# Patient Record
Sex: Male | Born: 1995 | Race: White | Hispanic: No | Marital: Single | Smoking: Former smoker
Health system: Southern US, Community
[De-identification: ages and names within clinical notes are randomized; demographics above are authoritative.]

## PROBLEM LIST (undated history)

## (undated) DIAGNOSIS — E119 Type 2 diabetes mellitus without complications: Secondary | ICD-10-CM

---
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
## 2017-02-18 ENCOUNTER — Other Ambulatory Visit: Payer: Self-pay

## 2017-02-18 MED ORDER — INSULIN ASPART 100 UNIT/ML FLEXPEN: PEN_INJECTOR | SUBCUTANEOUS | 2 refills | Status: DC

## 2017-03-24 ENCOUNTER — Other Ambulatory Visit: Payer: Self-pay

## 2017-03-24 MED ORDER — INSULIN GLARGINE 100 UNIT/ML SOLOSTAR PEN: PEN_INJECTOR | SUBCUTANEOUS | 2 refills | Status: DC

## 2017-04-18 ENCOUNTER — Ambulatory Visit: Payer: Medicaid Other | Admitting: "Endocrinology

## 2017-04-28 LAB — COMPLETE METABOLIC PANEL WITH GFR
AG RATIO: 1.9 (calc) (ref 1.0–2.5)
ALT: 13 U/L (ref 8–46)
AST: 12 U/L (ref 12–32)
Albumin: 4.7 g/dL (ref 3.6–5.1)
Alkaline phosphatase (APISO): 85 U/L (ref 48–230)
BILIRUBIN TOTAL: 0.8 mg/dL (ref 0.2–1.1)
BUN: 13 mg/dL (ref 7–20)
CO2: 29 mmol/L (ref 20–32)
Calcium: 10.1 mg/dL (ref 8.9–10.4)
Chloride: 100 mmol/L (ref 98–110)
Creat: 0.92 mg/dL (ref 0.60–1.26)
GFR, Est African American: 139 mL/min/{1.73_m2} (ref >=60)
GFR, Est Non African American: 120 mL/min/{1.73_m2} (ref >=60)
Globulin: 2.5 g/dL (calc) (ref 2.1–3.5)
Glucose, Bld: 327 mg/dL — ABNORMAL HIGH (ref 65–99)
POTASSIUM: 4.8 mmol/L (ref 3.8–5.1)
SODIUM: 137 mmol/L (ref 135–146)
Total Protein: 7.2 g/dL (ref 6.3–8.2)

## 2017-04-28 LAB — HEMOGLOBIN A1C
Hgb A1c MFr Bld: 8.7 % of total Hgb — ABNORMAL HIGH (ref <5.7)
Mean Plasma Glucose: 203 (calc)
eAG (mmol/L): 11.2 (calc)

## 2017-04-28 LAB — VITAMIN D 25 HYDROXY (VIT D DEFICIENCY, FRACTURES): Vit D, 25-Hydroxy: 24 ng/mL — ABNORMAL LOW (ref 30–100)

## 2017-05-06 ENCOUNTER — Ambulatory Visit: Payer: Medicaid Other | Admitting: "Endocrinology

## 2017-06-11 ENCOUNTER — Other Ambulatory Visit: Payer: Self-pay | Admitting: "Endocrinology

## 2017-07-24 ENCOUNTER — Other Ambulatory Visit: Payer: Self-pay | Admitting: "Endocrinology

## 2017-07-26 ENCOUNTER — Other Ambulatory Visit: Payer: Self-pay | Admitting: "Endocrinology

## 2017-07-26 DIAGNOSIS — E1065 Type 1 diabetes mellitus with hyperglycemia: Secondary | ICD-10-CM

## 2017-08-01 ENCOUNTER — Other Ambulatory Visit: Payer: Self-pay | Admitting: "Endocrinology

## 2017-08-02 NOTE — Telephone Encounter (Signed)
No refills until he returns for OV with repeat labs. He can get a sample pen to cover any gaps.

## 2017-08-03 ENCOUNTER — Telehealth: Payer: Self-pay | Admitting: "Endocrinology

## 2017-08-03 NOTE — Telephone Encounter (Signed)
I have responded to this message yesterday, he can get samples to cover gaps.  He has to have labs and office visit before we refill.

## 2017-08-03 NOTE — Telephone Encounter (Signed)
Ray Espinoza is scheduled on 08/23/17 and his grandmother is asking if Dr. Fransico HimNida would call in Novolog until he can be seen he is completely out, please advise?

## 2017-08-05 LAB — COMPREHENSIVE METABOLIC PANEL
AG Ratio: 2.1 (calc) (ref 1.0–2.5)
ALKALINE PHOSPHATASE (APISO): 88 U/L (ref 40–115)
ALT: 11 U/L (ref 9–46)
AST: 12 U/L (ref 10–40)
Albumin: 4.9 g/dL (ref 3.6–5.1)
BUN: 16 mg/dL (ref 7–25)
CO2: 30 mmol/L (ref 20–32)
CREATININE: 0.92 mg/dL (ref 0.60–1.35)
Calcium: 10.3 mg/dL (ref 8.6–10.3)
Chloride: 100 mmol/L (ref 98–110)
Globulin: 2.3 g/dL (calc) (ref 1.9–3.7)
Glucose, Bld: 256 mg/dL — ABNORMAL HIGH (ref 65–99)
Potassium: 4.6 mmol/L (ref 3.5–5.3)
Sodium: 138 mmol/L (ref 135–146)
Total Bilirubin: 0.6 mg/dL (ref 0.2–1.2)
Total Protein: 7.2 g/dL (ref 6.1–8.1)

## 2017-08-05 LAB — HEMOGLOBIN A1C
HEMOGLOBIN A1C: 7.5 %{Hb} — AB (ref <5.7)
MEAN PLASMA GLUCOSE: 169 (calc)
eAG (mmol/L): 9.3 (calc)

## 2017-08-09 ENCOUNTER — Ambulatory Visit (INDEPENDENT_AMBULATORY_CARE_PROVIDER_SITE_OTHER): Payer: Medicaid Other | Admitting: "Endocrinology

## 2017-08-09 ENCOUNTER — Encounter: Payer: Self-pay | Admitting: "Endocrinology

## 2017-08-09 VITALS — BP 136/81 | HR 80 | Ht 66.0 in | Wt 164.0 lb

## 2017-08-09 DIAGNOSIS — Z9119 Patient's noncompliance with other medical treatment and regimen: Secondary | ICD-10-CM | POA: Diagnosis not present

## 2017-08-09 DIAGNOSIS — E1065 Type 1 diabetes mellitus with hyperglycemia: Secondary | ICD-10-CM | POA: Diagnosis not present

## 2017-08-09 DIAGNOSIS — Z91199 Patient's noncompliance with other medical treatment and regimen due to unspecified reason: Secondary | ICD-10-CM

## 2017-08-09 NOTE — Progress Notes (Signed)
Subjective:    Patient ID: Ray Espinoza, male    DOB: 1995-07-22. Patient is being seen in f/u for management of diabetes requested by  Sharilyn Sites, MD  Past Medical History:  Diagnosis Date  . Diabetes mellitus without complication ([place])    History reviewed. No pertinent surgical history. Social History   Socioeconomic History  . Marital status: Single    Spouse name: Not on file  . Number of children: Not on file  . Years of education: Not on file  . Highest education level: Not on file  Occupational History  . Not on file  Social Needs  . Financial resource strain: Not on file  . Food insecurity:    Worry: Not on file    Inability: Not on file  . Transportation needs:    Medical: Not on file    Non-medical: Not on file  Tobacco Use  . Smoking status: Never Smoker  . Smokeless tobacco: Never Used  Substance and Sexual Activity  . Alcohol use: No  . Drug use: No  . Sexual activity: Not on file  Lifestyle  . Physical activity:    Days per week: Not on file    Minutes per session: Not on file  . Stress: Not on file  Relationships  . Social connections:    Talks on phone: Not on file    Gets together: Not on file    Attends religious service: Not on file    Active member of club or organization: Not on file    Attends meetings of clubs or organizations: Not on file    Relationship status: Not on file  Other Topics Concern  . Not on file  Social History Narrative  . Not on file   Outpatient Encounter Medications as of 08/09/2017  Medication Sig  . GLOBAL EASE INJECT PEN NEEDLES 31G X 5 MM MISC USE AS DIRECTED FOUR TIMES DAILY  . Insulin Glargine (LANTUS SOLOSTAR) 100 UNIT/ML Solostar Pen INJECT 30 UNITS INTO THE SKIN AT BEDTIME  . NOVOLOG FLEXPEN 100 UNIT/ML FlexPen INJECT 10 - 16 UNITS UNDER THE SKIN THREE TIMES DAILY BEFORE MEALS  . [DISCONTINUED] ACCU-CHEK AVIVA PLUS test strip USE UP TO FOUR TIMES DAILY AS DIRECTED  . [DISCONTINUED] blood  glucose meter kit and supplies KIT Dispense based on patient and insurance preference. Use up to four times daily as directed. (FOR ICD-10 E10.65).  . [DISCONTINUED] GLOBAL EASE INJECT PEN NEEDLES 31G X 5 MM MISC USE AS DIRECTED FOUR TIMES DAILY   No facility-administered encounter medications on file as of 08/09/2017.    ALLERGIES: Allergies  Allergen Reactions  . Codeine Hives and Itching   VACCINATION STATUS:  There is no immunization history on file for this patient.  Diabetes  He presents for his follow-up diabetic visit. He has type 1 diabetes mellitus. Onset time: He was diagnosed at approximate age of 40 years. His disease course has been stable (He was diagnosed with A1c of 14.1%. ). There are no hypoglycemic associated symptoms. Pertinent negatives for hypoglycemia include no confusion, headaches, pallor or seizures. Pertinent negatives for diabetes include no chest pain, no fatigue, no polydipsia, no polyphagia, no polyuria and no weakness. There are no hypoglycemic complications. Symptoms are stable. There are no diabetic complications. Risk factors for coronary artery disease include diabetes mellitus. Current diabetic treatments: He is using Lantus 31 units daily at bedtime and NovoLog 1-7 units 3 times a day before meals. Compliance with diabetes treatment: He  missed at least a third of his insulin and monitoring opportunities. He sleeps through the morning and misses breakfast on a regular basis. His weight is fluctuating minimally. He is following a generally unhealthy diet. When asked about meal planning, he reported none. He has not had a previous visit with a dietitian. He participates in exercise intermittently. Blood glucose monitoring compliance is poor. His home blood glucose trend is decreasing steadily. His overall blood glucose range is 140-180 mg/dl. (He came with a meter showing random monitoring of less than 1 time a day average of 179-  22 readings in the last 30 days was  supposed to monitor blood glucose 4 times a day.) An ACE inhibitor/angiotensin II receptor blocker is not being taken. He does not see a podiatrist.Eye exam is not current.    Review of Systems  Constitutional: Negative for chills, fatigue, fever and unexpected weight change.  HENT: Negative for dental problem, mouth sores and trouble swallowing.   Eyes: Negative for visual disturbance.  Respiratory: Negative for cough, choking, chest tightness, shortness of breath and wheezing.   Cardiovascular: Negative for chest pain, palpitations and leg swelling.  Gastrointestinal: Negative for abdominal distention, abdominal pain, constipation, diarrhea, nausea and vomiting.  Endocrine: Negative for polydipsia, polyphagia and polyuria.  Genitourinary: Negative for dysuria, flank pain, hematuria and urgency.  Musculoskeletal: Negative for back pain, gait problem, myalgias and neck pain.  Skin: Negative for pallor, rash and wound.  Neurological: Negative for seizures, syncope, weakness, numbness and headaches.  Psychiatric/Behavioral: Negative.  Negative for confusion and dysphoric mood.    Objective:    BP 136/81   Pulse 80   Ht '5\' 6"'$  (1.676 m)   Wt 164 lb (74.4 kg)   BMI 26.47 kg/m   Wt Readings from Last 3 Encounters:  08/09/17 164 lb (74.4 kg)  12/27/16 161 lb (73 kg) (60 %, Z= 0.25)*  06/15/16 159 lb (72.1 kg) (60 %, Z= 0.25)*   * Growth percentiles are based on CDC (Boys, 2-20 Years) data.    Physical Exam  Constitutional: He is oriented to person, place, and time. He appears well-developed. He is cooperative. No distress.  HENT:  Head: Normocephalic and atraumatic.  Eyes: EOM are normal.  Neck: Normal range of motion. Neck supple. No tracheal deviation present. No thyromegaly present.  Cardiovascular: Normal rate, S1 normal and S2 normal. Exam reveals no gallop.  No murmur heard. Pulses:      Dorsalis pedis pulses are 1+ on the right side, and 1+ on the left side.       Posterior  tibial pulses are 1+ on the right side, and 1+ on the left side.  Pulmonary/Chest: Effort normal. No respiratory distress. He has no wheezes.  Abdominal: He exhibits no distension. There is no tenderness. There is no guarding and no CVA tenderness.  Musculoskeletal: He exhibits no edema.       Right shoulder: He exhibits no swelling and no deformity.  Neurological: He is alert and oriented to person, place, and time. He has normal strength. No cranial nerve deficit or sensory deficit. Gait normal.  Skin: Skin is warm and dry. No rash noted. No cyanosis. Nails show no clubbing.  Psychiatric: He has a normal mood and affect. His speech is normal. Judgment normal. Cognition and memory are normal.   CMP     Component Value Date/Time   NA 138 08/04/2017 0809   K 4.6 08/04/2017 0809   CL 100 08/04/2017 0809   CO2 30 08/04/2017  0809   GLUCOSE 256 (H) 08/04/2017 0809   BUN 16 08/04/2017 0809   BUN 15 12/20/2016   CREATININE 0.92 08/04/2017 0809   CALCIUM 10.3 08/04/2017 0809   PROT 7.2 08/04/2017 0809   ALBUMIN 4.8 03/08/2016 0837   AST 12 08/04/2017 0809   ALT 11 08/04/2017 0809   ALKPHOS 108 03/08/2016 0837   BILITOT 0.6 08/04/2017 0809   GFRNONAA 120 04/27/2017 1300   GFRAA 139 04/27/2017 1300    Diabetic Labs (most recent): Lab Results  Component Value Date   HGBA1C 7.5 (H) 08/04/2017   HGBA1C 8.7 (H) 04/27/2017   HGBA1C 7.8 (H) 12/20/2016     Assessment & Plan:   1. Uncontrolled type 1 diabetes mellitus   - Patient has currently uncontrolled symptomatic type 2 DM since  22 years of age. -Once again, he shows up after missing his appointment since November 2018.  His A1c remains stable at 7.5%, 7.8% during his last visit, generally improving from 14.1%. -Did not comply with monitoring recommendations. - I reviewed his most recent labs with him. These show normal renal function and chemistry.  He does not report any gross competitions of diabetes, however, given his  noncompliance to follow-ups and recommendations, patient remains at a high risk for more acute and chronic complications of diabetes which include CAD, CVA, CKD, retinopathy, and neuropathy. These are all discussed in detail with the patient.  - I have counseled the patient on diet management  by adopting a carbohydrate restricted/protein rich diet.  -  Suggestion is made for him to avoid simple carbohydrates  from his diet including Cakes, Sweet Desserts / Pastries, Ice Cream, Soda (diet and regular), Sweet Tea, Candies, Chips, Cookies, Store Bought Juices, Alcohol in Excess of  1-2 drinks a day, Artificial Sweeteners, and "Sugar-free" Products. This will help patient to have stable blood glucose profile and potentially avoid unintended weight gain.  - I encouraged the patient to switch to  unprocessed or minimally processed complex starch and increased protein intake (animal or plant source), fruits, and vegetables.  - Patient is advised to stick to a routine mealtimes to eat 3 meals  a day and avoid unnecessary snacks ( to snack only to correct hypoglycemia).   - I have approached patient with the following individualized plan to manage diabetes and patient agrees:   -Once again, he missed his appointments since November 2018.  -He did not bring his insulin injection activities nor did he monitor enough.  This makes it difficult to stabilize his insulin doses. -  I advised him to continue  Lantus  30 units daily at bedtime, and  NovoLog   10 units  3 times a day before meals for pre-meal BG readings of 90-140m/dl, plus patient specific correction dose for unexpected hyperglycemia above 1534mdl, associated with strict monitoring of blood glucose 4 times a day-before meals and at bedtime and return in 1 to 2 weeks for reevaluation. - Patient is warned not to take insulin without proper monitoring per orders. -Adjustment parameters are given for hypo and hyperglycemia in writing. -Patient is  encouraged to call clinic for blood glucose levels less than 70 or above 300 mg /dl. - Insulin is the exclusive choice of therapy for his diabetes. - He would have benefitted from continued glucose monitoring, however he is not interested to get it.  - Patient specific target  A1c;  LDL, HDL, Triglycerides, and  Waist Circumference were discussed in detail.  2) BP/HTN: His blood pressure is  controlled to target.   He is not on any medications for blood pressure.   3) Lipids/HPL:  Controlled based on his labs with LDL of 84 and HDL 46 from December 2016. 4)  Weight/Diet: CDE Consult is initiated , exercise, and detailed carbohydrates information provided.  5) Chronic Care/Health Maintenance:  -Patient is encouraged to continue to follow up with Ophthalmology, Podiatrist at least yearly or according to recommendations, and advised to   stay away from smoking. I have recommended yearly flu vaccine and pneumonia vaccination at least every 5 years; moderate intensity exercise for up to 150 minutes weekly; and  sleep for at least 7 hours a day.  - I advised patient to maintain close follow up with Sharilyn Sites, MD for primary care needs.  - Time spent with the patient: 25 min, of which >50% was spent in reviewing his blood glucose logs , discussing his hypo- and hyper-glycemic episodes, reviewing his current and  previous labs and insulin doses and developing a plan to avoid hypo- and hyper-glycemia. Please refer to Patient Instructions for Blood Glucose Monitoring and Insulin/Medications Dosing Guide"  in media tab for additional information. Margretta Sidle Giovannini participated in the discussions, expressed understanding, and voiced agreement with the above plans.  All questions were answered to his satisfaction. he is encouraged to contact clinic should he have any questions or concerns prior to his return visit.    Follow up plan: - Return in about 1 week (around 08/16/2017) for follow up with meter  and logs- no labs.  Glade Lloyd, MD Phone: [phone]  Fax: [phone]  -  This note was partially dictated with voice recognition software. Similar sounding words can be transcribed inadequately or may not  be corrected upon review.  08/09/2017, 11:05 AM

## 2017-08-15 ENCOUNTER — Other Ambulatory Visit: Payer: Self-pay

## 2017-08-15 MED ORDER — INSULIN ASPART 100 UNIT/ML FLEXPEN: PEN_INJECTOR | SUBCUTANEOUS | 2 refills | Status: DC

## 2017-08-15 MED ORDER — INSULIN GLARGINE 100 UNIT/ML SOLOSTAR PEN: PEN_INJECTOR | SUBCUTANEOUS | 2 refills | Status: DC

## 2017-08-18 ENCOUNTER — Ambulatory Visit (INDEPENDENT_AMBULATORY_CARE_PROVIDER_SITE_OTHER): Payer: Medicaid Other | Admitting: "Endocrinology

## 2017-08-18 ENCOUNTER — Encounter: Payer: Self-pay | Admitting: "Endocrinology

## 2017-08-18 VITALS — BP 130/69 | HR 94 | Ht 66.0 in | Wt 163.0 lb

## 2017-08-18 DIAGNOSIS — Z9119 Patient's noncompliance with other medical treatment and regimen: Secondary | ICD-10-CM | POA: Diagnosis not present

## 2017-08-18 DIAGNOSIS — Z91199 Patient's noncompliance with other medical treatment and regimen due to unspecified reason: Secondary | ICD-10-CM

## 2017-08-18 DIAGNOSIS — E1065 Type 1 diabetes mellitus with hyperglycemia: Secondary | ICD-10-CM

## 2017-08-18 NOTE — Progress Notes (Signed)
Subjective:    Patient ID: Ray Espinoza, male    DOB: Jun 01, 1995. Patient is being seen in f/u for management of diabetes requested by  Assunta Found, MD  Past Medical History:  Diagnosis Date  . Diabetes mellitus without complication (HCC)    History reviewed. No pertinent surgical history. Social History   Socioeconomic History  . Marital status: Single    Spouse name: Not on file  . Number of children: Not on file  . Years of education: Not on file  . Highest education level: Not on file  Occupational History  . Not on file  Social Needs  . Financial resource strain: Not on file  . Food insecurity:    Worry: Not on file    Inability: Not on file  . Transportation needs:    Medical: Not on file    Non-medical: Not on file  Tobacco Use  . Smoking status: Never Smoker  . Smokeless tobacco: Never Used  Substance and Sexual Activity  . Alcohol use: No  . Drug use: No  . Sexual activity: Not on file  Lifestyle  . Physical activity:    Days per week: Not on file    Minutes per session: Not on file  . Stress: Not on file  Relationships  . Social connections:    Talks on phone: Not on file    Gets together: Not on file    Attends religious service: Not on file    Active member of club or organization: Not on file    Attends meetings of clubs or organizations: Not on file    Relationship status: Not on file  Other Topics Concern  . Not on file  Social History Narrative  . Not on file   Outpatient Encounter Medications as of 08/18/2017  Medication Sig  . GLOBAL EASE INJECT PEN NEEDLES 31G X 5 MM MISC USE AS DIRECTED FOUR TIMES DAILY  . insulin aspart (NOVOLOG FLEXPEN) 100 UNIT/ML FlexPen INJECT 10 - 16 UNITS UNDER THE SKIN THREE TIMES DAILY BEFORE MEALS  . Insulin Glargine (LANTUS SOLOSTAR) 100 UNIT/ML Solostar Pen INJECT 30 UNITS INTO THE SKIN AT BEDTIME   No facility-administered encounter medications on file as of 08/18/2017.    ALLERGIES: Allergies   Allergen Reactions  . Codeine Hives and Itching   VACCINATION STATUS:  There is no immunization history on file for this patient.  Diabetes  He presents for his follow-up diabetic visit. He has type 1 diabetes mellitus. Onset time: He was diagnosed at approximate age of 14 years. His disease course has been worsening (He was diagnosed with A1c of 14.1%. ). There are no hypoglycemic associated symptoms. Pertinent negatives for hypoglycemia include no confusion, headaches, pallor or seizures. Associated symptoms include polydipsia and polyuria. Pertinent negatives for diabetes include no chest pain, no fatigue, no polyphagia and no weakness. There are no hypoglycemic complications. Symptoms are worsening. There are no diabetic complications. Risk factors for coronary artery disease include diabetes mellitus. Current diabetic treatments: He is using Lantus 31 units daily at bedtime and NovoLog 1-7 units 3 times a day before meals. Compliance with diabetes treatment: He missed at least a third of his insulin and monitoring opportunities. He sleeps through the morning and misses breakfast on a regular basis. His weight is stable. He is following a generally unhealthy diet. When asked about meal planning, he reported none. He has not had a previous visit with a dietitian. He participates in exercise intermittently. He monitors  blood glucose at home 1-2 x per day. Blood glucose monitoring compliance is poor. His home blood glucose trend is decreasing steadily. His overall blood glucose range is >200 mg/dl. (He came with a meter showing random monitoring of glucose 1-2 times a day on  average , averaging 201.  He was supposed to monitor blood glucose 4 times a day.  ) An ACE inhibitor/angiotensin II receptor blocker is not being taken. He does not see a podiatrist.Eye exam is not current.    Review of Systems  Constitutional: Negative for chills, fatigue, fever and unexpected weight change.  HENT: Negative  for dental problem, mouth sores and trouble swallowing.   Eyes: Negative for visual disturbance.  Respiratory: Negative for cough, choking, chest tightness, shortness of breath and wheezing.   Cardiovascular: Negative for chest pain, palpitations and leg swelling.  Gastrointestinal: Negative for abdominal distention, abdominal pain, constipation, diarrhea, nausea and vomiting.  Endocrine: Positive for polydipsia and polyuria. Negative for polyphagia.  Genitourinary: Negative for dysuria, flank pain, hematuria and urgency.  Musculoskeletal: Negative for back pain, gait problem, myalgias and neck pain.  Skin: Negative for pallor, rash and wound.  Neurological: Negative for seizures, syncope, weakness, numbness and headaches.  Psychiatric/Behavioral: Negative.  Negative for confusion and dysphoric mood.    Objective:    BP 130/69   Pulse 94   Ht 5\' 6"  (1.676 m)   Wt 163 lb (73.9 kg)   BMI 26.31 kg/m   Wt Readings from Last 3 Encounters:  08/18/17 163 lb (73.9 kg)  08/09/17 164 lb (74.4 kg)  12/27/16 161 lb (73 kg) (60 %, Z= 0.25)*   * Growth percentiles are based on CDC (Boys, 2-20 Years) data.    Physical Exam  Constitutional: He is oriented to person, place, and time. He appears well-developed. He is cooperative. No distress.  HENT:  Head: Normocephalic and atraumatic.  Eyes: EOM are normal.  Neck: Normal range of motion. Neck supple. No tracheal deviation present. No thyromegaly present.  Cardiovascular: Normal rate, S1 normal and S2 normal. Exam reveals no gallop.  No murmur heard. Pulses:      Dorsalis pedis pulses are 1+ on the right side, and 1+ on the left side.       Posterior tibial pulses are 1+ on the right side, and 1+ on the left side.  Pulmonary/Chest: Effort normal. No respiratory distress. He has no wheezes.  Abdominal: He exhibits no distension. There is no tenderness. There is no guarding and no CVA tenderness.  Musculoskeletal: He exhibits no edema.        Right shoulder: He exhibits no swelling and no deformity.  Neurological: He is alert and oriented to person, place, and time. He has normal strength. No cranial nerve deficit or sensory deficit. Gait normal.  Skin: Skin is warm and dry. No rash noted. No cyanosis. Nails show no clubbing.  Psychiatric: He has a normal mood and affect. His speech is normal. Judgment normal. Cognition and memory are normal.   CMP     Component Value Date/Time   NA 138 08/04/2017 0809   K 4.6 08/04/2017 0809   CL 100 08/04/2017 0809   CO2 30 08/04/2017 0809   GLUCOSE 256 (H) 08/04/2017 0809   BUN 16 08/04/2017 0809   BUN 15 12/20/2016   CREATININE 0.92 08/04/2017 0809   CALCIUM 10.3 08/04/2017 0809   PROT 7.2 08/04/2017 0809   ALBUMIN 4.8 03/08/2016 0837   AST 12 08/04/2017 0809   ALT 11 08/04/2017  0809   ALKPHOS 108 03/08/2016 0837   BILITOT 0.6 08/04/2017 0809   GFRNONAA 120 04/27/2017 1300   GFRAA 139 04/27/2017 1300    Diabetic Labs (most recent): Lab Results  Component Value Date   HGBA1C 7.5 (H) 08/04/2017   HGBA1C 8.7 (H) 04/27/2017   HGBA1C 7.8 (H) 12/20/2016     Assessment & Plan:   1. Uncontrolled type 1 diabetes mellitus   - Patient has currently uncontrolled symptomatic type 2 DM since  22 years of age. -Patient remains alarmingly noncompliant.  He monitored blood glucose 1.5 times daily on average when he was supposed to do at least for monitoring daily.    -He missed several insulin injection opportunities.  His most recent A1c was 7.5%   - I reviewed his most recent labs with him. These show normal renal function and chemistry.  He does not report any gross competitions of diabetes, however, given his noncompliance to follow-ups and recommendations, patient remains at a high risk for more acute and chronic complications of diabetes which include CAD, CVA, CKD, retinopathy, and neuropathy. These are all discussed in detail with the patient.  - I have counseled the patient on  compliance to medications, diet management  by adopting a carbohydrate restricted/protein rich diet.  -  Suggestion is made for him to avoid simple carbohydrates  from his diet including Cakes, Sweet Desserts / Pastries, Ice Cream, Soda (diet and regular), Sweet Tea, Candies, Chips, Cookies, Store Bought Juices, Alcohol in Excess of  1-2 drinks a day, Artificial Sweeteners, and "Sugar-free" Products. This will help patient to have stable blood glucose profile and potentially avoid unintended weight gain.   - I encouraged the patient to switch to  unprocessed or minimally processed complex starch and increased protein intake (animal or plant source), fruits, and vegetables.  - Patient is advised to stick to a routine mealtimes to eat 3 meals  a day and avoid unnecessary snacks ( to snack only to correct hypoglycemia).   - I have approached patient with the following individualized plan to manage diabetes and patient agrees:   -Once again, he remains alarmingly noncompliant.  He did not follow recommendations for monitoring and insulin coverage of his meals.   He returns with 17 readings in the last 14 days averaging 201.   -  This makes it difficult to give him long-term insulin program.   -  I advised him to refocus and resume Lantus  30 units daily at bedtime, and  NovoLog   10 units  3 times a day before meals for pre-meal BG readings of 90-150mg /dl, plus patient specific correction dose for unexpected hyperglycemia above 150mg /dl, associated with strict monitoring of blood glucose 4 times a day-before meals and at bedtime and return in 1 to 2 weeks for reevaluation. - Patient is warned not to take insulin without proper monitoring per orders. -Adjustment parameters are given for hypo and hyperglycemia in writing. -Patient is encouraged to call clinic for blood glucose levels less than 70 or above 300 mg /dl. - Insulin is the exclusive choice of therapy for his diabetes. - He would have  benefitted from continued glucose monitoring, however he is not interested to get it.  - Patient specific target  A1c;  LDL, HDL, Triglycerides, and  Waist Circumference were discussed in detail.  2) BP/HTN: His blood pressure is controlled to target.  He is not on any medications for blood pressure.   3) Lipids/HPL:  Controlled based on his labs  with LDL of 84 and HDL 46 from December 2016. 4)  Weight/Diet: CDE Consult is initiated , exercise, and detailed carbohydrates information provided.  5) Chronic Care/Health Maintenance:  -Patient is encouraged to continue to follow up with Ophthalmology, Podiatrist at least yearly or according to recommendations, and advised to   stay away from smoking. I have recommended yearly flu vaccine and pneumonia vaccination at least every 5 years; moderate intensity exercise for up to 150 minutes weekly; and  sleep for at least 7 hours a day.  - I advised patient to maintain close follow up with Assunta Found, MD for primary care needs.  - Time spent with the patient: 25 min, of which >50% was spent in reviewing his blood glucose logs , discussing his hypo- and hyper-glycemic episodes, reviewing his current and  previous labs and insulin doses and developing a plan to avoid hypo- and hyper-glycemia. Please refer to Patient Instructions for Blood Glucose Monitoring and Insulin/Medications Dosing Guide"  in media tab for additional information.  I also spent significant amount of time counseling him on compliance to monitoring and insulin administration.  Raoul Pitch Helming participated in the discussions, expressed understanding, and voiced agreement with the above plans.  All questions were answered to his satisfaction. he is encouraged to contact clinic should he have any questions or concerns prior to his return visit.     Follow up plan: - Return in about 3 months (around 11/18/2017) for follow up with pre-visit labs, meter, and logs.  Marquis Lunch,  MD Phone: [phone]  Fax: [phone]  -  This note was partially dictated with voice recognition software. Similar sounding words can be transcribed inadequately or may not  be corrected upon review.  08/18/2017, 9:48 AM

## 2017-08-23 ENCOUNTER — Ambulatory Visit: Payer: Medicaid Other | Admitting: "Endocrinology

## 2017-10-25 ENCOUNTER — Encounter: Payer: Self-pay | Admitting: "Endocrinology

## 2017-11-18 ENCOUNTER — Ambulatory Visit: Payer: Medicaid Other | Admitting: "Endocrinology

## 2017-11-24 ENCOUNTER — Other Ambulatory Visit: Payer: Self-pay | Admitting: "Endocrinology

## 2017-12-01 LAB — COMPLETE METABOLIC PANEL WITH GFR
AG Ratio: 2 (calc) (ref 1.0–2.5)
ALBUMIN MSPROF: 4.5 g/dL (ref 3.6–5.1)
ALT: 14 U/L (ref 9–46)
AST: 11 U/L (ref 10–40)
Alkaline phosphatase (APISO): 74 U/L (ref 40–115)
BUN: 11 mg/dL (ref 7–25)
CO2: 27 mmol/L (ref 20–32)
CREATININE: 0.83 mg/dL (ref 0.60–1.35)
Calcium: 9.9 mg/dL (ref 8.6–10.3)
Chloride: 101 mmol/L (ref 98–110)
GFR, EST NON AFRICAN AMERICAN: 127 mL/min/{1.73_m2} (ref >=60)
GFR, Est African American: 147 mL/min/{1.73_m2} (ref >=60)
GLOBULIN: 2.2 g/dL (ref 1.9–3.7)
Glucose, Bld: 194 mg/dL — ABNORMAL HIGH (ref 65–99)
Potassium: 4.6 mmol/L (ref 3.5–5.3)
SODIUM: 135 mmol/L (ref 135–146)
Total Bilirubin: 0.9 mg/dL (ref 0.2–1.2)
Total Protein: 6.7 g/dL (ref 6.1–8.1)

## 2017-12-01 LAB — HEMOGLOBIN A1C
Hgb A1c MFr Bld: 7.2 % of total Hgb — ABNORMAL HIGH (ref <5.7)
MEAN PLASMA GLUCOSE: 160 (calc)
eAG (mmol/L): 8.9 (calc)

## 2017-12-02 ENCOUNTER — Ambulatory Visit (INDEPENDENT_AMBULATORY_CARE_PROVIDER_SITE_OTHER): Payer: Medicaid Other | Admitting: "Endocrinology

## 2017-12-02 ENCOUNTER — Encounter: Payer: Self-pay | Admitting: "Endocrinology

## 2017-12-02 VITALS — BP 136/84 | HR 79 | Ht 66.0 in | Wt 146.0 lb

## 2017-12-02 DIAGNOSIS — E1065 Type 1 diabetes mellitus with hyperglycemia: Secondary | ICD-10-CM | POA: Diagnosis not present

## 2017-12-02 MED ORDER — INSULIN ASPART 100 UNIT/ML FLEXPEN: PEN_INJECTOR | SUBCUTANEOUS | 2 refills | Status: DC

## 2017-12-02 MED ORDER — INSULIN GLARGINE 100 UNIT/ML SOLOSTAR PEN: PEN_INJECTOR | SUBCUTANEOUS | 2 refills | Status: DC

## 2017-12-02 NOTE — Patient Instructions (Signed)

## 2017-12-02 NOTE — Progress Notes (Signed)
Subjective:    Patient ID: Ray Espinoza, male    DOB: February 05, 1996. Patient is being seen in f/u for management of diabetes requested by  Assunta Found, MD  Past Medical History:  Diagnosis Date  . Diabetes mellitus without complication (HCC)    History reviewed. No pertinent surgical history. Social History   Socioeconomic History  . Marital status: Single    Spouse name: Not on file  . Number of children: Not on file  . Years of education: Not on file  . Highest education level: Not on file  Occupational History  . Not on file  Social Needs  . Financial resource strain: Not on file  . Food insecurity:    Worry: Not on file    Inability: Not on file  . Transportation needs:    Medical: Not on file    Non-medical: Not on file  Tobacco Use  . Smoking status: Never Smoker  . Smokeless tobacco: Never Used  Substance and Sexual Activity  . Alcohol use: No  . Drug use: No  . Sexual activity: Not on file  Lifestyle  . Physical activity:    Days per week: Not on file    Minutes per session: Not on file  . Stress: Not on file  Relationships  . Social connections:    Talks on phone: Not on file    Gets together: Not on file    Attends religious service: Not on file    Active member of club or organization: Not on file    Attends meetings of clubs or organizations: Not on file    Relationship status: Not on file  Other Topics Concern  . Not on file  Social History Narrative  . Not on file   Outpatient Encounter Medications as of 12/02/2017  Medication Sig  . GLOBAL EASE INJECT PEN NEEDLES 31G X 5 MM MISC USE AS DIRECTED FOUR TIMES DAILY  . insulin aspart (NOVOLOG FLEXPEN) 100 UNIT/ML FlexPen INJECT 10-16 UNITS UNDER THE SKIN THREE TIMES DAILY BEFORE MEALS  . Insulin Glargine (LANTUS SOLOSTAR) 100 UNIT/ML Solostar Pen INJECT 30 UNITS INTO THE SKIN AT BEDTIME  . [DISCONTINUED] Insulin Glargine (LANTUS SOLOSTAR) 100 UNIT/ML Solostar Pen INJECT 30 UNITS INTO THE  SKIN AT BEDTIME  . [DISCONTINUED] NOVOLOG FLEXPEN 100 UNIT/ML FlexPen INJECT 10-16 UNITS UNDER THE SKIN THREE TIMES DAILY BEFORE MEALS   No facility-administered encounter medications on file as of 12/02/2017.    ALLERGIES: Allergies  Allergen Reactions  . Codeine Hives and Itching   VACCINATION STATUS:  There is no immunization history on file for this patient.  Diabetes  He presents for his follow-up diabetic visit. He has type 1 diabetes mellitus. Onset time: He was diagnosed at approximate age of 14 years. His disease course has been improving (He was diagnosed with A1c of 14.1%. ). There are no hypoglycemic associated symptoms. Pertinent negatives for hypoglycemia include no confusion, headaches, pallor or seizures. Pertinent negatives for diabetes include no chest pain, no fatigue, no polydipsia, no polyphagia, no polyuria and no weakness. There are no hypoglycemic complications. Symptoms are improving. There are no diabetic complications. Risk factors for coronary artery disease include diabetes mellitus. Current diabetic treatments: He is using Lantus 31 units daily at bedtime and NovoLog 1-7 units 3 times a day before meals. Compliance with diabetes treatment: He missed at least a third of his insulin and monitoring opportunities. He sleeps through the morning and misses breakfast on a regular basis. His weight  is decreasing steadily. He is following a generally unhealthy diet. When asked about meal planning, he reported none. He has not had a previous visit with a dietitian. He participates in exercise intermittently. He monitors blood glucose at home 3-4 x per day. Blood glucose monitoring compliance is adequate. His home blood glucose trend is decreasing steadily. His breakfast blood glucose range is generally 140-180 mg/dl. His lunch blood glucose range is generally 140-180 mg/dl. His dinner blood glucose range is generally 140-180 mg/dl. His bedtime blood glucose range is generally  140-180 mg/dl. His overall blood glucose range is 140-180 mg/dl. ( ) An ACE inhibitor/angiotensin II receptor blocker is not being taken. He does not see a podiatrist.Eye exam is not current.    Review of Systems  Constitutional: Negative for chills, fatigue, fever and unexpected weight change.  HENT: Negative for dental problem, mouth sores and trouble swallowing.   Eyes: Negative for visual disturbance.  Respiratory: Negative for cough, choking, chest tightness, shortness of breath and wheezing.   Cardiovascular: Negative for chest pain, palpitations and leg swelling.  Gastrointestinal: Negative for abdominal distention, abdominal pain, constipation, diarrhea, nausea and vomiting.  Endocrine: Negative for polydipsia, polyphagia and polyuria.  Genitourinary: Negative for dysuria, flank pain, hematuria and urgency.  Musculoskeletal: Negative for back pain, gait problem, myalgias and neck pain.  Skin: Negative for pallor, rash and wound.  Neurological: Negative for seizures, syncope, weakness, numbness and headaches.  Psychiatric/Behavioral: Negative.  Negative for confusion and dysphoric mood.    Objective:    BP 136/84   Pulse 79   Ht 5\' 6"  (1.676 m)   Wt 146 lb (66.2 kg)   BMI 23.57 kg/m   Wt Readings from Last 3 Encounters:  12/02/17 146 lb (66.2 kg)  08/18/17 163 lb (73.9 kg)  08/09/17 164 lb (74.4 kg)    Physical Exam  Constitutional: He is oriented to person, place, and time. He appears well-developed. He is cooperative. No distress.  HENT:  Head: Normocephalic and atraumatic.  Eyes: EOM are normal.  Neck: Normal range of motion. Neck supple. No tracheal deviation present. No thyromegaly present.  Cardiovascular: Normal rate, S1 normal and S2 normal. Exam reveals no gallop.  No murmur heard. Pulses:      Dorsalis pedis pulses are 1+ on the right side, and 1+ on the left side.       Posterior tibial pulses are 1+ on the right side, and 1+ on the left side.   Pulmonary/Chest: Effort normal. No respiratory distress. He has no wheezes.  Abdominal: He exhibits no distension. There is no tenderness. There is no guarding and no CVA tenderness.  Musculoskeletal: He exhibits no edema.       Right shoulder: He exhibits no swelling and no deformity.  Neurological: He is alert and oriented to person, place, and time. He has normal strength. No cranial nerve deficit or sensory deficit. Gait normal.  Skin: Skin is warm and dry. No rash noted. No cyanosis. Nails show no clubbing.  Psychiatric: He has a normal mood and affect. His speech is normal. Judgment normal. Cognition and memory are normal.     Diabetic Labs (most recent): Lab Results  Component Value Date   HGBA1C 7.2 (H) 11/30/2017   HGBA1C 7.5 (H) 08/04/2017   HGBA1C 8.7 (H) 04/27/2017   Recent Results (from the past 2160 hour(s))  COMPLETE METABOLIC PANEL WITH GFR     Status: Abnormal   Collection Time: 11/30/17  9:00 AM  Result Value Ref Range  Glucose, Bld 194 (H) 65 - 99 mg/dL    Comment: .            Fasting reference interval . For someone without known diabetes, a glucose value >125 mg/dL indicates that they may have diabetes and this should be confirmed with a follow-up test. .    BUN 11 7 - 25 mg/dL   Creat 1.61 0.96 - 0.45 mg/dL   GFR, Est Non African American 127 > OR = 60 mL/min/1.40m2   GFR, Est African American 147 > OR = 60 mL/min/1.40m2   BUN/Creatinine Ratio NOT APPLICABLE 6 - 22 (calc)   Sodium 135 135 - 146 mmol/L   Potassium 4.6 3.5 - 5.3 mmol/L   Chloride 101 98 - 110 mmol/L   CO2 27 20 - 32 mmol/L   Calcium 9.9 8.6 - 10.3 mg/dL   Total Protein 6.7 6.1 - 8.1 g/dL   Albumin 4.5 3.6 - 5.1 g/dL   Globulin 2.2 1.9 - 3.7 g/dL (calc)   AG Ratio 2.0 1.0 - 2.5 (calc)   Total Bilirubin 0.9 0.2 - 1.2 mg/dL   Alkaline phosphatase (APISO) 74 40 - 115 U/L   AST 11 10 - 40 U/L   ALT 14 9 - 46 U/L  Hemoglobin A1c     Status: Abnormal   Collection Time: 11/30/17  9:00  AM  Result Value Ref Range   Hgb A1c MFr Bld 7.2 (H) <5.7 % of total Hgb    Comment: For someone without known diabetes, a hemoglobin A1c value of 6.5% or greater indicates that they may have  diabetes and this should be confirmed with a follow-up  test. . For someone with known diabetes, a value <7% indicates  that their diabetes is well controlled and a value  greater than or equal to 7% indicates suboptimal  control. A1c targets should be individualized based on  duration of diabetes, age, comorbid conditions, and  other considerations. . Currently, no consensus exists regarding use of hemoglobin A1c for diagnosis of diabetes for children. .    Mean Plasma Glucose 160 (calc)   eAG (mmol/L) 8.9 (calc)     Assessment & Plan:   1. Uncontrolled type 1 diabetes mellitus   - Patient has currently uncontrolled symptomatic type 1 DM since  22 years of age. -He presents with significant improvement in his engagement and documentation of his injection activities.  His A1c is 7.2% and improving.   - I reviewed his most recent labs with him. These show normal renal function and chemistry.  He does not report any gross competitions of diabetes, however, patient remains at a high risk for more acute and chronic complications of diabetes which include CAD, CVA, CKD, retinopathy, and neuropathy. These are all discussed in detail with the patient.  - I have counseled the patient on compliance to medications, diet management  by adopting a carbohydrate restricted/protein rich diet.  -  Suggestion is made for him to avoid simple carbohydrates  from his diet including Cakes, Sweet Desserts / Pastries, Ice Cream, Soda (diet and regular), Sweet Tea, Candies, Chips, Cookies, Store Bought Juices, Alcohol in Excess of  1-2 drinks a day, Artificial Sweeteners, and "Sugar-free" Products. This will help patient to have stable blood glucose profile and potentially avoid unintended weight gain.  - I  encouraged the patient to switch to  unprocessed or minimally processed complex starch and increased protein intake (animal or plant source), fruits, and vegetables.  - Patient is advised to  stick to a routine mealtimes to eat 3 meals  a day and avoid unnecessary snacks ( to snack only to correct hypoglycemia).   - I have approached patient with the following individualized plan to manage diabetes and patient agrees:   -He presents with significant improvement in his engagement and documentation of insulin activities.  His A1c is 7.2% and improving.   -  He is advised to continue Lantus  30 units daily at bedtime, and  NovoLog   10 units  3 times a day before meals for pre-meal BG readings of 90-150mg /dl, plus patient specific correction dose for unexpected hyperglycemia above 150mg /dl, associated with strict monitoring of blood glucose 4 times a day-before meals and at bedtime.  - Patient is warned not to take insulin without proper monitoring per orders. -Adjustment parameters are given for hypo and hyperglycemia in writing. -Patient is encouraged to call clinic for blood glucose levels less than 70 or above 300 mg /dl. - Insulin is the exclusive choice of therapy for his diabetes.   - Patient specific target  A1c;  LDL, HDL, Triglycerides, and  Waist Circumference were discussed in detail.  2) BP/HTN: He is blood pressure is controlled to target.  lood pressure is controlled to target.  He is not on any medications for blood pressure.   3) Lipids/HPL: His recent lipid panel showed controlled LDL of 84.  He is not on statins.  He will be considered for yearly fasting lipid panel.   4)  Weight/Diet: CDE Consult is initiated , exercise, and detailed carbohydrates information provided.  5) Chronic Care/Health Maintenance:  -Patient is encouraged to continue to follow up with Ophthalmology, Podiatrist at least yearly or according to recommendations, and advised to   stay away from smoking. I  have recommended yearly flu vaccine and pneumonia vaccination at least every 5 years; moderate intensity exercise for up to 150 minutes weekly; and  sleep for at least 7 hours a day.  - I advised patient to maintain close follow up with Assunta Found, MD for primary care needs.  - Time spent with the patient: 25 min, of which >50% was spent in reviewing his  current and  previous labs, previous treatments, and medications doses and developing a plan for long-term care.  Raoul Pitch Colonna participated in the discussions, expressed understanding, and voiced agreement with the above plans.  All questions were answered to his satisfaction. he is encouraged to contact clinic should he have any questions or concerns prior to his return visit.    Follow up plan: - Return in about 4 months (around 04/04/2018) for Meter, and Logs, Follow up with Pre-visit Labs, Meter, and Logs.  Marquis Lunch, MD Phone: [phone]  Fax: [phone]  -  This note was partially dictated with voice recognition software. Similar sounding words can be transcribed inadequately or may not  be corrected upon review.  12/02/2017, 12:47 PM

## 2017-12-30 ENCOUNTER — Other Ambulatory Visit: Payer: Self-pay | Admitting: "Endocrinology

## 2018-04-04 ENCOUNTER — Ambulatory Visit: Payer: Medicaid Other | Admitting: "Endocrinology

## 2018-04-28 ENCOUNTER — Ambulatory Visit: Payer: Medicaid Other | Admitting: "Endocrinology

## 2018-06-13 ENCOUNTER — Other Ambulatory Visit: Payer: Self-pay | Admitting: "Endocrinology

## 2018-06-16 ENCOUNTER — Other Ambulatory Visit: Payer: Self-pay | Admitting: "Endocrinology

## 2018-08-17 ENCOUNTER — Other Ambulatory Visit: Payer: Self-pay | Admitting: "Endocrinology

## 2018-08-21 ENCOUNTER — Telehealth: Payer: Self-pay | Admitting: "Endocrinology

## 2018-08-21 NOTE — Telephone Encounter (Signed)
Pt has not been here since 10/19. He did not make appt.

## 2018-08-21 NOTE — Telephone Encounter (Signed)
He can get some tresiba or toujeo samples to use in place of lantus, and has to stay in contact with his PMD.

## 2018-08-21 NOTE — Telephone Encounter (Signed)
Pt's mom notified

## 2018-08-21 NOTE — Telephone Encounter (Signed)
Pt left a VM that he needs a RX refill on his Lantus. Patient needs an appt, asking for a partial refill until he can come in.

## 2018-08-22 ENCOUNTER — Other Ambulatory Visit: Payer: Self-pay | Admitting: "Endocrinology

## 2018-08-22 DIAGNOSIS — E1065 Type 1 diabetes mellitus with hyperglycemia: Secondary | ICD-10-CM

## 2018-09-16 LAB — COMPREHENSIVE METABOLIC PANEL
AG Ratio: 2.3 (calc) (ref 1.0–2.5)
ALT: 12 U/L (ref 9–46)
AST: 9 U/L — ABNORMAL LOW (ref 10–40)
Albumin: 4.9 g/dL (ref 3.6–5.1)
Alkaline phosphatase (APISO): 77 U/L (ref 36–130)
BUN: 12 mg/dL (ref 7–25)
CO2: 29 mmol/L (ref 20–32)
Calcium: 10.4 mg/dL — ABNORMAL HIGH (ref 8.6–10.3)
Chloride: 105 mmol/L (ref 98–110)
Creat: 0.78 mg/dL (ref 0.60–1.35)
Globulin: 2.1 g/dL (calc) (ref 1.9–3.7)
Glucose, Bld: 159 mg/dL — ABNORMAL HIGH (ref 65–99)
Potassium: 3.9 mmol/L (ref 3.5–5.3)
Sodium: 140 mmol/L (ref 135–146)
Total Bilirubin: 0.9 mg/dL (ref 0.2–1.2)
Total Protein: 7 g/dL (ref 6.1–8.1)

## 2018-09-16 LAB — MICROALBUMIN / CREATININE URINE RATIO
Creatinine, Urine: 243 mg/dL (ref 20–320)
Microalb Creat Ratio: 146 mcg/mg creat — ABNORMAL HIGH (ref <30)
Microalb, Ur: 35.4 mg/dL

## 2018-09-16 LAB — HEMOGLOBIN A1C
Hgb A1c MFr Bld: 11.4 % of total Hgb — ABNORMAL HIGH (ref <5.7)
Mean Plasma Glucose: 280 (calc)
eAG (mmol/L): 15.5 (calc)

## 2018-09-22 ENCOUNTER — Encounter: Payer: Self-pay | Admitting: "Endocrinology

## 2018-09-22 ENCOUNTER — Other Ambulatory Visit: Payer: Self-pay

## 2018-09-22 ENCOUNTER — Ambulatory Visit (INDEPENDENT_AMBULATORY_CARE_PROVIDER_SITE_OTHER): Payer: Medicaid Other | Admitting: "Endocrinology

## 2018-09-22 VITALS — BP 126/78 | HR 93 | Temp 98.0°F | Ht 66.0 in | Wt 136.4 lb

## 2018-09-22 DIAGNOSIS — Z9119 Patient's noncompliance with other medical treatment and regimen: Secondary | ICD-10-CM

## 2018-09-22 DIAGNOSIS — E1065 Type 1 diabetes mellitus with hyperglycemia: Secondary | ICD-10-CM

## 2018-09-22 DIAGNOSIS — Z91199 Patient's noncompliance with other medical treatment and regimen due to unspecified reason: Secondary | ICD-10-CM

## 2018-09-22 MED ORDER — NOVOLOG FLEXPEN 100 UNIT/ML ~~LOC~~ SOPN: PEN_INJECTOR | SUBCUTANEOUS | 2 refills | Status: DC

## 2018-09-22 MED ORDER — LANTUS SOLOSTAR 100 UNIT/ML ~~LOC~~ SOPN: PEN_INJECTOR | SUBCUTANEOUS | 2 refills | Status: DC

## 2018-09-22 NOTE — Progress Notes (Signed)
09/22/2018       Endocrinology follow-up note   Subjective:    Patient ID: Ray Espinoza, male    DOB: [birth year]. Patient is being seen in f/u for management of diabetes requested by  Assunta FoundGolding, John, MD  Past Medical History:  Diagnosis Date  . Diabetes mellitus without complication (HCC)    History reviewed. No pertinent surgical history. Social History   Socioeconomic History  . Marital status: Single    Spouse name: Not on file  . Number of children: Not on file  . Years of education: Not on file  . Highest education level: Not on file  Occupational History  . Not on file  Social Needs  . Financial resource strain: Not on file  . Food insecurity    Worry: Not on file    Inability: Not on file  . Transportation needs    Medical: Not on file    Non-medical: Not on file  Tobacco Use  . Smoking status: Never Smoker  . Smokeless tobacco: Never Used  Substance and Sexual Activity  . Alcohol use: No  . Drug use: No  . Sexual activity: Not on file  Lifestyle  . Physical activity    Days per week: Not on file    Minutes per session: Not on file  . Stress: Not on file  Relationships  . Social Musicianconnections    Talks on phone: Not on file    Gets together: Not on file    Attends religious service: Not on file    Active member of club or organization: Not on file    Attends meetings of clubs or organizations: Not on file    Relationship status: Not on file  Other Topics Concern  . Not on file  Social History Narrative  . Not on file   Outpatient Encounter Medications as of 09/22/2018  Medication Sig  . GLOBAL EASE INJECT PEN NEEDLES 31G X 5 MM MISC USE AS DIRECTED FOUR TIMES DAILY  . GLOBAL EASE INJECT PEN NEEDLES 31G X 5 MM MISC USE AS DIRECTED FOUR TIMES DAILY  . insulin aspart (NOVOLOG FLEXPEN) 100 UNIT/ML FlexPen INJECT 10-16 UNITS UNDER THE SKIN THREE TIMES DAILY BEFORE MEALS  . Insulin Glargine (LANTUS SOLOSTAR) 100 UNIT/ML Solostar Pen INJECT 30 UNITS  INTO THE SKIN AT BEDTIME (THIS IS A 50 DAY SUPPLY)  . [DISCONTINUED] insulin aspart (NOVOLOG FLEXPEN) 100 UNIT/ML FlexPen INJECT 10-16 UNITS UNDER THE SKIN THREE TIMES DAILY BEFORE MEALS  . [DISCONTINUED] Insulin Glargine (LANTUS SOLOSTAR) 100 UNIT/ML Solostar Pen INJECT 30 UNITS INTO THE SKIN AT BEDTIME (THIS IS A 50 DAY SUPPLY)   No facility-administered encounter medications on file as of 09/22/2018.    ALLERGIES: Allergies  Allergen Reactions  . Codeine Hives and Itching   VACCINATION STATUS:  There is no immunization history on file for this patient.  Diabetes He presents for his follow-up diabetic visit. He has type 1 diabetes mellitus. Onset time: He was diagnosed at approximate age of 14 years. His disease course has been worsening (He was diagnosed with A1c of 14.1%. ). There are no hypoglycemic associated symptoms. Pertinent negatives for hypoglycemia include no confusion, headaches, pallor or seizures. Associated symptoms include polydipsia and polyuria. Pertinent negatives for diabetes include no chest pain, no fatigue, no polyphagia and no weakness. There are no hypoglycemic complications. Symptoms are worsening. Diabetic complications include nephropathy. Risk factors for coronary artery disease include diabetes mellitus. He is compliant with treatment none of the time. His  weight is decreasing steadily. He is following a generally unhealthy diet. When asked about meal planning, he reported none. He has not had a previous visit with a dietitian. He participates in exercise intermittently. He monitors blood glucose at home 3-4 x per week (He is taking only every other day when he was supposed to test 4 times a day.). There is no compliance with monitoring of blood glucose. His home blood glucose trend is increasing steadily. His overall blood glucose range is >200 mg/dl. ( She brought a meter showing 11 readings in the last 30 days averaging 309.  His most recent labs show A1c of 11.4%  increasing from 7.2%) An ACE inhibitor/angiotensin II receptor blocker is not being taken. He does not see a podiatrist.Eye exam is not current.    Review of Systems  Constitutional: Negative for chills, fatigue, fever and unexpected weight change.  HENT: Negative for dental problem, mouth sores and trouble swallowing.   Eyes: Negative for visual disturbance.  Respiratory: Negative for cough, choking, chest tightness, shortness of breath and wheezing.   Cardiovascular: Negative for chest pain, palpitations and leg swelling.  Gastrointestinal: Negative for abdominal distention, abdominal pain, constipation, diarrhea, nausea and vomiting.  Endocrine: Positive for polydipsia and polyuria. Negative for polyphagia.  Genitourinary: Negative for dysuria, flank pain, hematuria and urgency.  Musculoskeletal: Negative for back pain, gait problem, myalgias and neck pain.  Skin: Negative for pallor, rash and wound.  Neurological: Negative for seizures, syncope, weakness, numbness and headaches.  Psychiatric/Behavioral: Negative.  Negative for confusion and dysphoric mood.    Objective:    BP 126/78 (BP Location: Right Arm, Patient Position: Sitting, Cuff Size: Normal)   Pulse 93   Temp 98 F (36.7 C) (Oral)   Ht 5\' 6"  (1.676 m)   Wt 136 lb 6.4 oz (61.9 kg)   SpO2 99%   BMI 22.02 kg/m   Wt Readings from Last 3 Encounters:  09/22/18 136 lb 6.4 oz (61.9 kg)  12/02/17 146 lb (66.2 kg)  08/18/17 163 lb (73.9 kg)    Physical Exam  Constitutional: He is oriented to person, place, and time. He appears well-developed. He is cooperative. No distress.  HENT:  Head: Normocephalic and atraumatic.  Eyes: EOM are normal.  Neck: Normal range of motion. Neck supple. No tracheal deviation present. No thyromegaly present.  Cardiovascular: Normal rate, S1 normal and S2 normal. Exam reveals no gallop.  No murmur heard. Pulses:      Dorsalis pedis pulses are 1+ on the right side and 1+ on the left side.        Posterior tibial pulses are 1+ on the right side and 1+ on the left side.  Pulmonary/Chest: Effort normal. No respiratory distress. He has no wheezes.  Abdominal: He exhibits no distension. There is no abdominal tenderness. There is no guarding and no CVA tenderness.  Musculoskeletal:        General: No edema.     Right shoulder: He exhibits no swelling and no deformity.  Neurological: He is alert and oriented to person, place, and time. He has normal strength. No cranial nerve deficit or sensory deficit. Gait normal.  Skin: Skin is warm and dry. No rash noted. No cyanosis. Nails show no clubbing.  Psychiatric: He has a normal mood and affect. His speech is normal. Judgment normal. Cognition and memory are normal.  Patient has reluctant affect.     Diabetic Labs (most recent): Lab Results  Component Value Date   HGBA1C 11.4 (H)  09/15/2018   HGBA1C 7.2 (H) 11/30/2017   HGBA1C 7.5 (H) 08/04/2017   Recent Results (from the past 2160 hour(s))  Microalbumin/Creatinine Ratio, Urine     Status: Abnormal   Collection Time: 09/15/18 10:01 AM  Result Value Ref Range   Creatinine, Urine 243 20 - 320 mg/dL   Microalb, Ur 16.135.4 mg/dL    Comment: Verified by repeat analysis. Marland Kitchen. Reference Range Not established    Microalb Creat Ratio 146 (H) <30 mcg/mg creat    Comment: . The ADA defines abnormalities in albumin excretion as follows: Marland Kitchen. Category         Result (mcg/mg creatinine) . Normal                    <30 Microalbuminuria         30-299  Clinical albuminuria   > OR = 300 . The ADA recommends that at least two of three specimens collected within a 3-6 month period be abnormal before considering a patient to be within a diagnostic category.   Hemoglobin A1c     Status: Abnormal   Collection Time: 09/15/18 10:01 AM  Result Value Ref Range   Hgb A1c MFr Bld 11.4 (H) <5.7 % of total Hgb    Comment: For someone without known diabetes, a hemoglobin A1c value of 6.5% or greater  indicates that they may have  diabetes and this should be confirmed with a follow-up  test. . For someone with known diabetes, a value <7% indicates  that their diabetes is well controlled and a value  greater than or equal to 7% indicates suboptimal  control. A1c targets should be individualized based on  duration of diabetes, age, comorbid conditions, and  other considerations. . Currently, no consensus exists regarding use of hemoglobin A1c for diagnosis of diabetes for children. .    Mean Plasma Glucose 280 (calc)   eAG (mmol/L) 15.5 (calc)  Comprehensive metabolic panel     Status: Abnormal   Collection Time: 09/15/18 10:01 AM  Result Value Ref Range   Glucose, Bld 159 (H) 65 - 99 mg/dL    Comment: .            Fasting reference interval . For someone without known diabetes, a glucose value >125 mg/dL indicates that they may have diabetes and this should be confirmed with a follow-up test. .    BUN 12 7 - 25 mg/dL   Creat 0.960.78 0.450.60 - 4.091.35 mg/dL   BUN/Creatinine Ratio NOT APPLICABLE 6 - 22 (calc)   Sodium 140 135 - 146 mmol/L   Potassium 3.9 3.5 - 5.3 mmol/L   Chloride 105 98 - 110 mmol/L   CO2 29 20 - 32 mmol/L   Calcium 10.4 (H) 8.6 - 10.3 mg/dL   Total Protein 7.0 6.1 - 8.1 g/dL   Albumin 4.9 3.6 - 5.1 g/dL   Globulin 2.1 1.9 - 3.7 g/dL (calc)   AG Ratio 2.3 1.0 - 2.5 (calc)   Total Bilirubin 0.9 0.2 - 1.2 mg/dL   Alkaline phosphatase (APISO) 77 36 - 130 U/L   AST 9 (L) 10 - 40 U/L   ALT 12 9 - 46 U/L     Assessment & Plan:   1. Uncontrolled type 1 diabetes mellitus   - Patient has currently uncontrolled symptomatic type 1 DM since  23 years of age. -Once again, he missed his appointment since October 2019 and returns with significant loss of control of his diabetes with A1c  of 11.4% increasing from 7.2%.   -He is disengaged from self-care activities, monitoring only every third day when he was supposed to test blood glucose 4 times daily.  His meter  shows 11 readings in the last 30 days, averaging 309. - I reviewed his most recent labs with him. These show mild microalbuminuria , normal chemistry.    He does not report any gross competitions of diabetes, however, patient remains at a high risk for more acute and chronic complications of diabetes which include CAD, CVA, CKD, retinopathy, and neuropathy. These are all discussed in detail with the patient.  - I have counseled the patient on compliance to medications, diet management  by adopting a carbohydrate restricted/protein rich diet.  - I encouraged the patient to switch to  unprocessed or minimally processed complex starch and increased protein intake (animal or plant source), fruits, and vegetables.  - Patient is advised to stick to a routine mealtimes to eat 3 meals  a day and avoid unnecessary snacks ( to snack only to correct hypoglycemia).   - I have approached patient with the following individualized plan to manage diabetes and patient agrees:    -Had a long discussion with the patient about the need to resume intensive treatment with basal/bolus insulin associated with proper monitoring of blood glucose. -He is advised to resume Lantus 30 units daily at bedtime, resume NovoLog 10 units  3 times a day before meals for pre-meal BG readings of 90-150mg /dl, plus patient specific correction dose for unexpected hyperglycemia above 150mg /dl, associated with strict monitoring of blood glucose 4 times a day-before meals and at bedtime.  - Patient is warned not to take insulin without proper monitoring per orders. -Adjustment parameters are given for hypo and hyperglycemia in writing. -Patient is encouraged to call clinic for blood glucose levels less than 70 or above 300 mg /dl. - Insulin is the exclusive choice of therapy for his diabetes.   - Patient specific target  A1c;  LDL, HDL, Triglycerides, and  Waist Circumference were discussed in detail.  2) BP/HTN: His blood pressure is  controlled.  Lood pressure is controlled to target.  He is not on any medications for blood pressure.   3) Lipids/HPL: His recent lipid panel showed controlled LDL of 84.  He is not on statins.  He will be considered for yearly fasting lipid panel.   4)  Weight/Diet: CDE Consult is initiated , exercise, and detailed carbohydrates information provided.  5) Chronic Care/Health Maintenance:  -Patient is encouraged to continue to follow up with Ophthalmology, Podiatrist at least yearly or according to recommendations, and advised to   stay away from smoking. I have recommended yearly flu vaccine and pneumonia vaccination at least every 5 years; moderate intensity exercise for up to 150 minutes weekly; and  sleep for at least 7 hours a day.  - I advised patient to maintain close follow up with Assunta Found, MD for primary care needs. - Patient Care Time Today:  25 min, of which >50% was spent in  counseling and the rest reviewing his  current and  previous labs/studies, previous treatments, his blood glucose readings, and medications' doses and developing a plan for long-term care based on the latest recommendations for standards of care.   Raoul Pitch Pagel participated in the discussions, expressed understanding, and voiced agreement with the above plans.  All questions were answered to his satisfaction. he is encouraged to contact clinic should he have any questions or concerns prior to his  return visit.   Follow up plan: - Return in about 4 weeks (around 10/20/2018) for Follow up with [facility] Only - no Labs.  Marquis LunchGebre Tynisha Ogan, MD Phone: [phone]  Fax: [phone]  -  This note was partially dictated with voice recognition software. Similar sounding words can be transcribed inadequately or may not  be corrected upon review.  09/22/2018, 10:55 AM

## 2018-10-27 ENCOUNTER — Other Ambulatory Visit: Payer: Self-pay

## 2018-10-27 ENCOUNTER — Ambulatory Visit (INDEPENDENT_AMBULATORY_CARE_PROVIDER_SITE_OTHER): Payer: Medicaid Other | Admitting: "Endocrinology

## 2018-10-27 ENCOUNTER — Encounter: Payer: Self-pay | Admitting: "Endocrinology

## 2018-10-27 DIAGNOSIS — E1065 Type 1 diabetes mellitus with hyperglycemia: Secondary | ICD-10-CM | POA: Diagnosis not present

## 2018-10-27 MED ORDER — NOVOLOG FLEXPEN 100 UNIT/ML ~~LOC~~ SOPN: PEN_INJECTOR | SUBCUTANEOUS | 2 refills | Status: DC

## 2018-10-27 MED ORDER — LANTUS SOLOSTAR 100 UNIT/ML ~~LOC~~ SOPN: PEN_INJECTOR | SUBCUTANEOUS | 2 refills | Status: DC

## 2018-10-27 NOTE — Progress Notes (Signed)
10/27/2018       Endocrinology follow-up note   Subjective:    Patient ID: Ray Espinoza, male    DOB: 1995/08/29. Patient is being seen in f/u for management of diabetes requested by  Assunta FoundGolding, John, MD  Past Medical History:  Diagnosis Date  . Diabetes mellitus without complication (HCC)    History reviewed. No pertinent surgical history. Social History   Socioeconomic History  . Marital status: Single    Spouse name: Not on file  . Number of children: Not on file  . Years of education: Not on file  . Highest education level: Not on file  Occupational History  . Not on file  Social Needs  . Financial resource strain: Not on file  . Food insecurity    Worry: Not on file    Inability: Not on file  . Transportation needs    Medical: Not on file    Non-medical: Not on file  Tobacco Use  . Smoking status: Never Smoker  . Smokeless tobacco: Never Used  Substance and Sexual Activity  . Alcohol use: No  . Drug use: No  . Sexual activity: Not on file  Lifestyle  . Physical activity    Days per week: Not on file    Minutes per session: Not on file  . Stress: Not on file  Relationships  . Social Musicianconnections    Talks on phone: Not on file    Gets together: Not on file    Attends religious service: Not on file    Active member of club or organization: Not on file    Attends meetings of clubs or organizations: Not on file    Relationship status: Not on file  Other Topics Concern  . Not on file  Social History Narrative  . Not on file   Outpatient Encounter Medications as of 10/27/2018  Medication Sig  . GLOBAL EASE INJECT PEN NEEDLES 31G X 5 MM MISC USE AS DIRECTED FOUR TIMES DAILY  . GLOBAL EASE INJECT PEN NEEDLES 31G X 5 MM MISC USE AS DIRECTED FOUR TIMES DAILY  . insulin aspart (NOVOLOG FLEXPEN) 100 UNIT/ML FlexPen INJECT 10-16 UNITS UNDER THE SKIN THREE TIMES DAILY BEFORE MEALS  . Insulin Glargine (LANTUS SOLOSTAR) 100 UNIT/ML Solostar Pen INJECT 30 UNITS  INTO THE SKIN AT BEDTIME (THIS IS A 50 DAY SUPPLY)  . [DISCONTINUED] insulin aspart (NOVOLOG FLEXPEN) 100 UNIT/ML FlexPen INJECT 10-16 UNITS UNDER THE SKIN THREE TIMES DAILY BEFORE MEALS  . [DISCONTINUED] Insulin Glargine (LANTUS SOLOSTAR) 100 UNIT/ML Solostar Pen INJECT 30 UNITS INTO THE SKIN AT BEDTIME (THIS IS A 50 DAY SUPPLY)   No facility-administered encounter medications on file as of 10/27/2018.    ALLERGIES: Allergies  Allergen Reactions  . Codeine Hives and Itching   VACCINATION STATUS:  There is no immunization history on file for this patient.  Diabetes He presents for his follow-up diabetic visit. He has type 1 diabetes mellitus. Onset time: He was diagnosed at approximate age of 14 years. His disease course has been improving (He was diagnosed with A1c of 14.1%. ). There are no hypoglycemic associated symptoms. Pertinent negatives for hypoglycemia include no confusion, headaches, pallor or seizures. Pertinent negatives for diabetes include no chest pain, no fatigue, no polydipsia, no polyphagia, no polyuria and no weakness. There are no hypoglycemic complications. Symptoms are improving. Diabetic complications include nephropathy. Risk factors for coronary artery disease include diabetes mellitus. He is compliant with treatment none of the time. He is following  a generally unhealthy diet. When asked about meal planning, he reported none. He has not had a previous visit with a dietitian. He participates in exercise intermittently. His home blood glucose trend is increasing steadily. His breakfast blood glucose range is generally 130-140 mg/dl. His lunch blood glucose range is generally 130-140 mg/dl. His dinner blood glucose range is generally 140-180 mg/dl. His bedtime blood glucose range is generally 140-180 mg/dl. His overall blood glucose range is 140-180 mg/dl. ( She brought a meter showing 11 readings in the last 30 days averaging 309.  His most recent labs show A1c of 11.4%  increasing from 7.2%) An ACE inhibitor/angiotensin II receptor blocker is not being taken. He does not see a podiatrist.Eye exam is not current.   Review of systems: Limited as above.  Objective:    There were no vitals taken for this visit.  Wt Readings from Last 3 Encounters:  09/22/18 136 lb 6.4 oz (61.9 kg)  12/02/17 146 lb (66.2 kg)  08/18/17 163 lb (73.9 kg)      Diabetic Labs (most recent): Lab Results  Component Value Date   HGBA1C 11.4 (H) 09/15/2018   HGBA1C 7.2 (H) 11/30/2017   HGBA1C 7.5 (H) 08/04/2017   Recent Results (from the past 2160 hour(s))  Microalbumin/Creatinine Ratio, Urine     Status: Abnormal   Collection Time: 09/15/18 10:01 AM  Result Value Ref Range   Creatinine, Urine 243 20 - 320 mg/dL   Microalb, Ur 35.4 mg/dL    Comment: Verified by repeat analysis. Marland Kitchen Reference Range Not established    Microalb Creat Ratio 146 (H) <30 mcg/mg creat    Comment: . The ADA defines abnormalities in albumin excretion as follows: Marland Kitchen Category         Result (mcg/mg creatinine) . Normal                    <30 Microalbuminuria         30-299  Clinical albuminuria   > OR = 300 . The ADA recommends that at least two of three specimens collected within a 3-6 month period be abnormal before considering a patient to be within a diagnostic category.   Hemoglobin A1c     Status: Abnormal   Collection Time: 09/15/18 10:01 AM  Result Value Ref Range   Hgb A1c MFr Bld 11.4 (H) <5.7 % of total Hgb    Comment: For someone without known diabetes, a hemoglobin A1c value of 6.5% or greater indicates that they may have  diabetes and this should be confirmed with a follow-up  test. . For someone with known diabetes, a value <7% indicates  that their diabetes is well controlled and a value  greater than or equal to 7% indicates suboptimal  control. A1c targets should be individualized based on  duration of diabetes, age, comorbid conditions, and  other  considerations. . Currently, no consensus exists regarding use of hemoglobin A1c for diagnosis of diabetes for children. .    Mean Plasma Glucose 280 (calc)   eAG (mmol/L) 15.5 (calc)  Comprehensive metabolic panel     Status: Abnormal   Collection Time: 09/15/18 10:01 AM  Result Value Ref Range   Glucose, Bld 159 (H) 65 - 99 mg/dL    Comment: .            Fasting reference interval . For someone without known diabetes, a glucose value >125 mg/dL indicates that they may have diabetes and this should be confirmed with a  follow-up test. .    BUN 12 7 - 25 mg/dL   Creat 4.090.78 8.110.60 - 9.141.35 mg/dL   BUN/Creatinine Ratio NOT APPLICABLE 6 - 22 (calc)   Sodium 140 135 - 146 mmol/L   Potassium 3.9 3.5 - 5.3 mmol/L   Chloride 105 98 - 110 mmol/L   CO2 29 20 - 32 mmol/L   Calcium 10.4 (H) 8.6 - 10.3 mg/dL   Total Protein 7.0 6.1 - 8.1 g/dL   Albumin 4.9 3.6 - 5.1 g/dL   Globulin 2.1 1.9 - 3.7 g/dL (calc)   AG Ratio 2.3 1.0 - 2.5 (calc)   Total Bilirubin 0.9 0.2 - 1.2 mg/dL   Alkaline phosphatase (APISO) 77 36 - 130 U/L   AST 9 (L) 10 - 40 U/L   ALT 12 9 - 46 U/L     Assessment & Plan:   1. Uncontrolled type 1 diabetes mellitus   - Patient has currently uncontrolled symptomatic type 1 DM since  23 years of age. -After missing his appointment, he recently showed up with worsening A1c of 11.4%.  Since then he has engaged better in controlling his glycemic profile to near target ranges.    - I reviewed his most recent labs with him. These show mild microalbuminuria , normal chemistry.    He does not report any gross competitions of diabetes, however, patient remains at a high risk for more acute and chronic complications of diabetes which include CAD, CVA, CKD, retinopathy, and neuropathy. These are all discussed in detail with the patient.  - I have counseled the patient on compliance to medications, diet management  by adopting a carbohydrate restricted/protein rich diet.  - I  encouraged the patient to switch to  unprocessed or minimally processed complex starch and increased protein intake (animal or plant source), fruits, and vegetables.  - Patient is advised to stick to a routine mealtimes to eat 3 meals  a day and avoid unnecessary snacks ( to snack only to correct hypoglycemia).   - I have approached patient with the following individualized plan to manage diabetes and patient agrees:   -I had a long discussion about the need to stay engaged in self-care for diabetes. -He will continue to require intensive treatment with basal/bolus insulin in order for him to maintain control of diabetes to target. -He is advised to continue Lantus 30 units daily at bedtime,  NovoLog  10 units  3 times a day before meals for pre-meal BG readings of 90-150mg /dl, plus patient specific correction dose for unexpected hyperglycemia above 150mg /dl, associated with strict monitoring of blood glucose 4 times a day-before meals and at bedtime.  - Patient is warned not to take insulin without proper monitoring per orders. -Adjustment parameters are given for hypo and hyperglycemia in writing. -Patient is encouraged to call clinic for blood glucose levels less than 70 or above 300 mg /dl. - Insulin is the exclusive choice of therapy for his diabetes.   - Patient specific target  A1c;  LDL, HDL, Triglycerides, and  Waist Circumference were discussed in detail.  2) BP/HTN: he is advised to home monitor blood pressure and report if > 140/90 on 2 separate readings.   He is not on any medications for blood pressure.   3) Lipids/HPL: His recent lipid panel showed controlled LDL of 84.  He is not on statins.  He will be considered for yearly fasting lipid panel.   4)  Weight/Diet: CDE Consult is initiated , exercise, and detailed  carbohydrates information provided.  5) Chronic Care/Health Maintenance:  -Patient is encouraged to continue to follow up with Ophthalmology, Podiatrist at least  yearly or according to recommendations, and advised to   stay away from smoking. I have recommended yearly flu vaccine and pneumonia vaccination at least every 5 years; moderate intensity exercise for up to 150 minutes weekly; and  sleep for at least 7 hours a day.  - I advised patient to maintain close follow up with Assunta Found, MD for primary care needs.  - Patient Care Time Today:  25 min, of which >50% was spent in  counseling and the rest reviewing his  current and  previous labs/studies, previous treatments, his blood glucose readings, and medications' doses and developing a plan for long-term care based on the latest recommendations for standards of care.   Raoul Pitch Dabney participated in the discussions, expressed understanding, and voiced agreement with the above plans.  All questions were answered to his satisfaction. he is encouraged to contact clinic should he have any questions or concerns prior to his return visit.  Follow up plan: - Return in about 9 weeks (around 12/29/2018) for Bring Meter and Logs- A1c in Office.  Marquis Lunch, MD Phone: [phone]  Fax: [phone]  -  This note was partially dictated with voice recognition software. Similar sounding words can be transcribed inadequately or may not  be corrected upon review.  10/27/2018, 2:23 PM

## 2019-01-01 ENCOUNTER — Ambulatory Visit: Payer: Medicaid Other | Admitting: "Endocrinology

## 2019-03-05 ENCOUNTER — Other Ambulatory Visit: Payer: Self-pay | Admitting: "Endocrinology

## 2019-03-19 ENCOUNTER — Ambulatory Visit: Payer: Medicaid Other | Admitting: "Endocrinology

## 2019-03-30 ENCOUNTER — Ambulatory Visit: Payer: Medicaid Other | Admitting: "Endocrinology

## 2019-04-13 ENCOUNTER — Ambulatory Visit: Payer: Medicaid Other | Admitting: "Endocrinology

## 2019-04-20 ENCOUNTER — Ambulatory Visit (INDEPENDENT_AMBULATORY_CARE_PROVIDER_SITE_OTHER): Payer: Medicaid Other | Admitting: "Endocrinology

## 2019-04-20 ENCOUNTER — Other Ambulatory Visit: Payer: Self-pay

## 2019-04-20 ENCOUNTER — Encounter: Payer: Self-pay | Admitting: "Endocrinology

## 2019-04-20 VITALS — BP 133/82 | HR 72 | Ht 66.0 in | Wt 148.0 lb

## 2019-04-20 DIAGNOSIS — E1065 Type 1 diabetes mellitus with hyperglycemia: Secondary | ICD-10-CM | POA: Diagnosis not present

## 2019-04-20 LAB — POCT GLYCOSYLATED HEMOGLOBIN (HGB A1C): Hemoglobin A1C: 8.2 % — AB (ref 4.0–5.6)

## 2019-04-20 MED ORDER — NOVOLOG FLEXPEN 100 UNIT/ML ~~LOC~~ SOPN: PEN_INJECTOR | SUBCUTANEOUS | 2 refills | Status: DC

## 2019-04-20 MED ORDER — LANTUS SOLOSTAR 100 UNIT/ML ~~LOC~~ SOPN: PEN_INJECTOR | SUBCUTANEOUS | 2 refills | Status: DC

## 2019-04-20 MED ORDER — ACCU-CHEK GUIDE VI STRP: ORAL_STRIP | 2 refills | Status: DC

## 2019-04-20 NOTE — Progress Notes (Signed)
04/20/2019       Endocrinology follow-up note   Subjective:    Patient ID: Ray Espinoza, male    DOB: Aug 10, 1995.  He is here to follow-up  for management of diabetes requested by  Assunta Found, MD  Past Medical History:  Diagnosis Date  . Diabetes mellitus without complication (HCC)    History reviewed. No pertinent surgical history. Social History   Socioeconomic History  . Marital status: Single    Spouse name: Not on file  . Number of children: Not on file  . Years of education: Not on file  . Highest education level: Not on file  Occupational History  . Not on file  Tobacco Use  . Smoking status: Never Smoker  . Smokeless tobacco: Never Used  Substance and Sexual Activity  . Alcohol use: No  . Drug use: No  . Sexual activity: Not on file  Other Topics Concern  . Not on file  Social History Narrative  . Not on file   Social Determinants of Health   Financial Resource Strain:   . Difficulty of Paying Living Expenses: Not on file  Food Insecurity:   . Worried About Programme researcher, broadcasting/film/video in the Last Year: Not on file  . Ran Out of Food in the Last Year: Not on file  Transportation Needs:   . Lack of Transportation (Medical): Not on file  . Lack of Transportation (Non-Medical): Not on file  Physical Activity:   . Days of Exercise per Week: Not on file  . Minutes of Exercise per Session: Not on file  Stress:   . Feeling of Stress : Not on file  Social Connections:   . Frequency of Communication with Friends and Family: Not on file  . Frequency of Social Gatherings with Friends and Family: Not on file  . Attends Religious Services: Not on file  . Active Member of Clubs or Organizations: Not on file  . Attends Banker Meetings: Not on file  . Marital Status: Not on file   Outpatient Encounter Medications as of 04/20/2019  Medication Sig  . GLOBAL EASE INJECT PEN NEEDLES 31G X 5 MM MISC USE AS DIRECTED FOUR TIMES DAILY  . GLOBAL EASE  INJECT PEN NEEDLES 31G X 5 MM MISC USE AS DIRECTED FOUR TIMES DAILY  . glucose blood (ACCU-CHEK GUIDE) test strip Use as instructed  . insulin aspart (NOVOLOG FLEXPEN) 100 UNIT/ML FlexPen INJECT 10-16 UNITS UNDER THE SKIN THREE TIMES DAILY BEFORE MEALS  . Insulin Glargine (LANTUS SOLOSTAR) 100 UNIT/ML Solostar Pen INJECT 30 UNITS INTO THE SKIN AT BEDTIME (THIS IS A 50 DAY SUPPLY)  . [DISCONTINUED] insulin aspart (NOVOLOG FLEXPEN) 100 UNIT/ML FlexPen INJECT 10-16 UNITS UNDER THE SKIN THREE TIMES DAILY BEFORE MEALS  . [DISCONTINUED] Insulin Glargine (LANTUS SOLOSTAR) 100 UNIT/ML Solostar Pen INJECT 30 UNITS INTO THE SKIN AT BEDTIME (THIS IS A 50 DAY SUPPLY)   No facility-administered encounter medications on file as of 04/20/2019.   ALLERGIES: Allergies  Allergen Reactions  . Codeine Hives and Itching   VACCINATION STATUS:  There is no immunization history on file for this patient.  Diabetes He presents for his follow-up diabetic visit. He has type 1 diabetes mellitus. Onset time: He was diagnosed at approximate age of 14 years. His disease course has been improving (He was diagnosed with A1c of 14.1%. ). There are no hypoglycemic associated symptoms. Pertinent negatives for hypoglycemia include no confusion, headaches, pallor or seizures. Pertinent negatives for diabetes include  no chest pain, no fatigue, no polydipsia, no polyphagia, no polyuria and no weakness. There are no hypoglycemic complications. Symptoms are improving. Diabetic complications include nephropathy. Risk factors for coronary artery disease include diabetes mellitus. He is compliant with treatment none of the time. His weight is fluctuating minimally. He is following a generally unhealthy diet. When asked about meal planning, he reported none. He has not had a previous visit with a dietitian. He participates in exercise intermittently. His home blood glucose trend is increasing steadily. His overall blood glucose range is >200  mg/dl. ( He did not want diagnosed.  He brings in a meter showing average blood glucose of 201 over the last 30 days, however monitoring only once daily.    Did not bring any logs to review his insulin administration records.  ) An ACE inhibitor/angiotensin II receptor blocker is not being taken. He does not see a podiatrist.Eye exam is not current.    Review of systems  Constitutional: + Minimally fluctuating body weight,  current  Body mass index is 23.89 kg/m. , no fatigue, no subjective hyperthermia, no subjective hypothermia Eyes: no blurry vision, no xerophthalmia ENT: no sore throat, no nodules palpated in throat, no dysphagia/odynophagia, no hoarseness Cardiovascular: no Chest Pain, no Shortness of Breath, no palpitations, no leg swelling Respiratory: no cough, no shortness of breath Gastrointestinal: no Nausea/Vomiting/Diarhhea Musculoskeletal: no muscle/joint aches Skin: no rashes, no hyperemia Neurological: no tremors, no numbness, no tingling, no dizziness Psychiatric: no depression, no anxiety   Objective:    BP 133/82   Pulse 72   Ht 5\' 6"  (1.676 m)   Wt 148 lb (67.1 kg)   BMI 23.89 kg/m   Wt Readings from Last 3 Encounters:  04/20/19 148 lb (67.1 kg)  09/22/18 136 lb 6.4 oz (61.9 kg)  12/02/17 146 lb (66.2 kg)     Physical Exam- Limited  Constitutional:  Body mass index is 23.89 kg/m. , not in acute distress, normal state of mind Eyes:  EOMI, no exophthalmos Neck: Supple Thyroid: No gross goiter Respiratory: Adequate breathing efforts Musculoskeletal: no gross deformities, strength intact in all four extremities, no gross restriction of joint movements Skin:  no rashes, no hyperemia Neurological: no tremor with outstretched hands,    Diabetic Labs (most recent): Lab Results  Component Value Date   HGBA1C 8.2 (A) 04/20/2019   HGBA1C 11.4 (H) 09/15/2018   HGBA1C 7.2 (H) 11/30/2017   Recent Results (from the past 2160 hour(s))  HgB A1c     Status:  Abnormal   Collection Time: 04/20/19  8:33 AM  Result Value Ref Range   Hemoglobin A1C 8.2 (A) 4.0 - 5.6 %   HbA1c POC (<> result, manual entry)     HbA1c, POC (prediabetic range)     HbA1c, POC (controlled diabetic range)     CMP Latest Ref Rng & Units 09/15/2018 11/30/2017 08/04/2017  Glucose 65 - 99 mg/dL 08/06/2017) 132(G) 401(U)  BUN 7 - 25 mg/dL 12 11 16   Creatinine 0.60 - 1.35 mg/dL 272(Z 3.66  Sodium 135 - 146 mmol/L 140 135 138  Potassium 3.5 - 5.3 mmol/L 3.9 4.6 4.6  Chloride 98 - 110 mmol/L 105 101 100  CO2 20 - 32 mmol/L 29 27 30   Calcium 8.6 - 10.3 mg/dL 10.4(H) 9.9 10.3  Total Protein 6.1 - 8.1 g/dL 7.0 6.7 7.2  Total Bilirubin 0.2 - 1.2 mg/dL 0.9 0.9 0.6  Alkaline Phos 48 - 230 U/L - - -  AST 10 -  40 U/L 9(L) 11 12  ALT 9 - 46 U/L 12 14 11      Results for ITZAE, MIRALLES (MRN [id number]) as of 04/20/2019 10:10  Ref. Range 11/14/2009 04:30 12/15/2015 10:03  TSH Latest Ref Range: 0.50 - 4.30 mIU/L 2.350 2.60  T4,Free(Direct) Latest Ref Range: 0.8 - 1.4 ng/dL 0.92 1.2      Assessment & Plan:   1. Uncontrolled type 1 diabetes mellitus   - Patient has currently uncontrolled symptomatic type 1 DM since  24 years of age. -He presents with inadequate monitoring, his A1c is 8.2% on point-of-care today, improving from 11.4%.    - I reviewed his most recent labs with him. These show mild microalbuminuria , normal chemistry.    He does not report any gross competitions of diabetes, however, patient remains at a high risk for more acute and chronic complications of diabetes which include CAD, CVA, CKD, retinopathy, and neuropathy. These are all discussed in detail with the patient.  - I have counseled the patient on compliance to medications, diet management  by adopting a carbohydrate restricted/protein rich diet.  - I encouraged the patient to switch to  unprocessed or minimally processed complex starch and increased protein intake (animal or plant source), fruits, and  vegetables.  - Patient is advised to stick to a routine mealtimes to eat 3 meals  a day and avoid unnecessary snacks ( to snack only to correct hypoglycemia).   - I have approached patient with the following individualized plan to manage diabetes and patient agrees:   -Once again, I had a discussion with him to engage better in self-care for diabetes.    -He will continue to require intensive treatment with basal/bolus insulin in order for him to maintain control of diabetes to target. -He is advised to resume and continue Lantus 30 units daily at bedtime, continue NovoLog 10 units  3 times a day before meals for pre-meal BG readings of 90-150mg /dl, plus patient specific correction dose for unexpected hyperglycemia above 150mg /dl, associated with strict monitoring of blood glucose 4 times a day-before meals and at bedtime.  - Patient is warned not to take insulin without proper monitoring per orders. -Adjustment parameters are given for hypo and hyperglycemia in writing. -Patient is encouraged to call clinic for blood glucose levels less than 70 or above 300 mg /dl. - Insulin is the exclusive choice of therapy for his diabetes.   - Patient specific target  A1c;  LDL, HDL, Triglycerides, and  Waist Circumference were discussed in detail.  2) BP/HTN: His blood pressure is controlled to target.   He is not on any medications for blood pressure.   3) Lipids/HPL: His recent lipid panel showed controlled LDL of 84.  He is not on statins.  He will be considered for fasting lipid panel before his next visit.   4)  Weight/Diet: CDE Consult is initiated , exercise, and detailed carbohydrates information provided.  5) Chronic Care/Health Maintenance:  -Patient is encouraged to continue to follow up with Ophthalmology, Podiatrist at least yearly or according to recommendations, and advised to   stay away from smoking. I have recommended yearly flu vaccine and pneumonia vaccination at least every 5  years; moderate intensity exercise for up to 150 minutes weekly; and  sleep for at least 7 hours a day.  - I advised patient to maintain close follow up with Sharilyn Sites, MD for primary care needs.  - Time spent on this patient care encounter:  35 min,  of which > 50% was spent in  counseling and the rest reviewing his blood glucose logs , discussing his hypoglycemia and hyperglycemia episodes, reviewing his current and  previous labs / studies  ( including abstraction from other facilities) and medications  doses and developing a  long term treatment plan and documenting his care.   Please refer to Patient Instructions for Blood Glucose Monitoring and Insulin/Medications Dosing Guide"  in media tab for additional information. Please  also refer to " Patient Self Inventory" in the Media  tab for reviewed elements of pertinent patient history.  Raoul Pitch Grafton participated in the discussions, expressed understanding, and voiced agreement with the above plans.  All questions were answered to his satisfaction. he is encouraged to contact clinic should he have any questions or concerns prior to his return visit.   Follow up plan: - Return in about 3 months (around 07/18/2019) for Follow up with Pre-visit Labs, Bring Meter and Logs- A1c in Office.  Marquis Lunch, MD Phone: [phone]  Fax: [phone]  -  This note was partially dictated with voice recognition software. Similar sounding words can be transcribed inadequately or may not  be corrected upon review.  04/20/2019, 9:09 AM

## 2019-04-20 NOTE — Patient Instructions (Signed)

## 2019-07-01 ENCOUNTER — Other Ambulatory Visit: Payer: Self-pay | Admitting: "Endocrinology

## 2019-07-20 ENCOUNTER — Ambulatory Visit: Payer: Medicaid Other | Admitting: "Endocrinology

## 2019-08-11 LAB — COMPLETE METABOLIC PANEL WITH GFR
AG Ratio: 2.2 (calc) (ref 1.0–2.5)
ALT: 11 U/L (ref 9–46)
AST: 12 U/L (ref 10–40)
Albumin: 4.9 g/dL (ref 3.6–5.1)
Alkaline phosphatase (APISO): 71 U/L (ref 36–130)
BUN: 12 mg/dL (ref 7–25)
CO2: 28 mmol/L (ref 20–32)
Calcium: 10.2 mg/dL (ref 8.6–10.3)
Chloride: 105 mmol/L (ref 98–110)
Creat: 0.72 mg/dL (ref 0.60–1.35)
GFR, Est African American: 153 mL/min/{1.73_m2} (ref >=60)
GFR, Est Non African American: 132 mL/min/{1.73_m2} (ref >=60)
Globulin: 2.2 g/dL (calc) (ref 1.9–3.7)
Glucose, Bld: 125 mg/dL — ABNORMAL HIGH (ref 65–99)
Potassium: 4.4 mmol/L (ref 3.5–5.3)
Sodium: 140 mmol/L (ref 135–146)
Total Bilirubin: 0.7 mg/dL (ref 0.2–1.2)
Total Protein: 7.1 g/dL (ref 6.1–8.1)

## 2019-08-11 LAB — LIPID PANEL
Cholesterol: 137 mg/dL (ref <200)
HDL: 47 mg/dL (ref >=40)
LDL Cholesterol (Calc): 77 mg/dL (calc)
Non-HDL Cholesterol (Calc): 90 mg/dL (calc) (ref <130)
Total CHOL/HDL Ratio: 2.9 (calc) (ref <5.0)
Triglycerides: 58 mg/dL (ref <150)

## 2019-08-11 LAB — TSH: TSH: 0.94 mIU/L (ref 0.40–4.50)

## 2019-08-11 LAB — MICROALBUMIN / CREATININE URINE RATIO
Creatinine, Urine: 334 mg/dL — ABNORMAL HIGH (ref 20–320)
Microalb Creat Ratio: 197 mcg/mg creat — ABNORMAL HIGH (ref <30)
Microalb, Ur: 65.9 mg/dL

## 2019-08-11 LAB — T4, FREE: Free T4: 1.3 ng/dL (ref 0.8–1.8)

## 2019-09-28 ENCOUNTER — Telehealth: Payer: Self-pay

## 2019-09-28 NOTE — Telephone Encounter (Signed)
Pt is calling for an appointment -- I scheduled him for 8-27 @ 9:30 He advised he had lab work done at [facility] a month ago. I told him if there was anything else needed one of the nurses would call to advise.

## 2019-09-28 NOTE — Telephone Encounter (Signed)
All ordered lab work is back, nothing further needed prior to appt

## 2019-10-02 ENCOUNTER — Other Ambulatory Visit: Payer: Self-pay | Admitting: "Endocrinology

## 2019-10-19 ENCOUNTER — Ambulatory Visit (INDEPENDENT_AMBULATORY_CARE_PROVIDER_SITE_OTHER): Payer: Medicaid Other | Admitting: Nurse Practitioner

## 2019-10-19 ENCOUNTER — Other Ambulatory Visit: Payer: Self-pay

## 2019-10-19 ENCOUNTER — Encounter: Payer: Self-pay | Admitting: Nurse Practitioner

## 2019-10-19 VITALS — BP 135/83 | HR 77 | Ht 66.0 in | Wt 142.4 lb

## 2019-10-19 DIAGNOSIS — E1065 Type 1 diabetes mellitus with hyperglycemia: Secondary | ICD-10-CM | POA: Diagnosis not present

## 2019-10-19 LAB — POCT GLYCOSYLATED HEMOGLOBIN (HGB A1C): Hemoglobin A1C: 7.1 % — AB (ref 4.0–5.6)

## 2019-10-19 NOTE — Patient Instructions (Signed)

## 2019-10-19 NOTE — Progress Notes (Signed)
10/19/2019       Endocrinology follow-up note   Subjective:    Patient ID: Ray Espinoza, male    DOB: 1995/04/24.  He is here to follow-up  for management of type 1 diabetes requested by  Assunta Found, MD  Past Medical History:  Diagnosis Date  . Diabetes mellitus without complication (HCC)    History reviewed. No pertinent surgical history. Social History   Socioeconomic History  . Marital status: Single    Spouse name: Not on file  . Number of children: Not on file  . Years of education: Not on file  . Highest education level: Not on file  Occupational History  . Not on file  Tobacco Use  . Smoking status: Current Every Day Smoker  . Smokeless tobacco: Never Used  Vaping Use  . Vaping Use: Never used  Substance and Sexual Activity  . Alcohol use: No  . Drug use: No  . Sexual activity: Not on file  Other Topics Concern  . Not on file  Social History Narrative  . Not on file   Social Determinants of Health   Financial Resource Strain:   . Difficulty of Paying Living Expenses: Not on file  Food Insecurity:   . Worried About Programme researcher, broadcasting/film/video in the Last Year: Not on file  . Ran Out of Food in the Last Year: Not on file  Transportation Needs:   . Lack of Transportation (Medical): Not on file  . Lack of Transportation (Non-Medical): Not on file  Physical Activity:   . Days of Exercise per Week: Not on file  . Minutes of Exercise per Session: Not on file  Stress:   . Feeling of Stress : Not on file  Social Connections:   . Frequency of Communication with Friends and Family: Not on file  . Frequency of Social Gatherings with Friends and Family: Not on file  . Attends Religious Services: Not on file  . Active Member of Clubs or Organizations: Not on file  . Attends Banker Meetings: Not on file  . Marital Status: Not on file   Outpatient Encounter Medications as of 10/19/2019  Medication Sig  . GLOBAL EASE INJECT PEN NEEDLES 31G X 5 MM  MISC USE AS DIRECTED FOUR TIMES DAILY  . glucose blood (ACCU-CHEK GUIDE) test strip Use as instructed  . insulin aspart (NOVOLOG FLEXPEN) 100 UNIT/ML FlexPen INJECT 10-16 UNITS UNDER THE SKIN THREE TIMES DAILY BEFORE MEALS  . Insulin Glargine (LANTUS SOLOSTAR) 100 UNIT/ML Solostar Pen INJECT 30 UNITS INTO THE SKIN AT BEDTIME (THIS IS A 50 DAY SUPPLY)   No facility-administered encounter medications on file as of 10/19/2019.   ALLERGIES: Allergies  Allergen Reactions  . Codeine Hives and Itching   VACCINATION STATUS:  There is no immunization history on file for this patient.  Diabetes He presents for his follow-up diabetic visit. He has type 1 diabetes mellitus. Onset time: He was diagnosed at approximate age of 14 years. His disease course has been improving. Hypoglycemia symptoms include nervousness/anxiousness, sweats and tremors. Pertinent negatives for hypoglycemia include no confusion, headaches, pallor or seizures. Pertinent negatives for diabetes include no chest pain, no fatigue, no polydipsia, no polyphagia, no polyuria and no weakness. There are no hypoglycemic complications. Symptoms are improving. Risk factors for coronary artery disease include diabetes mellitus and male sex. Current diabetic treatment includes intensive insulin program. He is compliant with treatment most of the time. His weight is fluctuating minimally. He is following  a generally unhealthy diet. When asked about meal planning, he reported none. He has not had a previous visit with a dietitian. He participates in exercise intermittently. His home blood glucose trend is decreasing steadily. His overall blood glucose range is 140-180 mg/dl. (He presents for his visit today without any logs.  He does bring his meter, however it only has limited readings as it is his backup meter.  His coworker accidentally threw away his previous meter.  He reports some rare episodes of hypoglycemia.  His POCT A1C today is 7.1%,  improving from 8.2% on last visit. ) An ACE inhibitor/angiotensin II receptor blocker is not being taken. He does not see a podiatrist.Eye exam is not current.    Review of systems  Constitutional: + Minimally fluctuating body weight,  current  Body mass index is 22.98 kg/m. , no fatigue, no subjective hyperthermia, no subjective hypothermia Eyes: no blurry vision, no xerophthalmia ENT: no sore throat, no nodules palpated in throat, no dysphagia/odynophagia, no hoarseness Cardiovascular: no Chest Pain, no Shortness of Breath, no palpitations, no leg swelling Respiratory: no cough, no shortness of breath Gastrointestinal: no Nausea/Vomiting/Diarhhea Musculoskeletal: no muscle/joint aches Skin: no rashes, no hyperemia Neurological: no tremors, no numbness, no tingling, no dizziness Psychiatric: no depression, no anxiety   Objective:    BP 135/83 (BP Location: Right Arm, Patient Position: Sitting)   Pulse 77   Ht 5\' 6"  (1.676 m)   Wt 142 lb 6.4 oz (64.6 kg)   BMI 22.98 kg/m   Wt Readings from Last 3 Encounters:  10/19/19 142 lb 6.4 oz (64.6 kg)  04/20/19 148 lb (67.1 kg)  09/22/18 136 lb 6.4 oz (61.9 kg)    BP Readings from Last 3 Encounters:  10/19/19 135/83  04/20/19 133/82  09/22/18 126/78   Physical Exam- Limited  Constitutional:  Body mass index is 22.98 kg/m. , not in acute distress, normal state of mind Eyes:  EOMI, no exophthalmos Neck: Supple Thyroid: No gross goiter Cardiovascular: RRR, no murmers, rubs, or gallops, no edema Respiratory: Adequate breathing efforts, no crackles, rales, rhonchi, or wheezing Musculoskeletal: no gross deformities, strength intact in all four extremities, no gross restriction of joint movements Skin:  no rashes, no hyperemia Neurological: no tremor with outstretched hands  Foot exam:   No rashes, ulcers, cuts, calluses, onychodystrophy.   Good pulses bilat.  Good sensation to 10 g monofilament bilat.   Diabetic Labs (most  recent): Lab Results  Component Value Date   HGBA1C 7.1 (A) 10/19/2019   HGBA1C 8.2 (A) 04/20/2019   HGBA1C 11.4 (H) 09/15/2018   Recent Results (from the past 2160 hour(s))  COMPLETE METABOLIC PANEL WITH GFR     Status: Abnormal   Collection Time: 08/10/19 10:53 AM  Result Value Ref Range   Glucose, Bld 125 (H) 65 - 99 mg/dL    Comment: .            Fasting reference interval . For someone without known diabetes, a glucose value between 100 and 125 mg/dL is consistent with prediabetes and should be confirmed with a follow-up test. .    BUN 12 7 - 25 mg/dL   Creat 08/12/19 1.02 - 5.85 mg/dL   GFR, Est Non African American 132 > OR = 60 mL/min/1.49m2   GFR, Est African American 153 > OR = 60 mL/min/1.71m2   BUN/Creatinine Ratio NOT APPLICABLE 6 - 22 (calc)   Sodium 140 135 - 146 mmol/L   Potassium 4.4 3.5 - 5.3 mmol/L  Chloride 105 98 - 110 mmol/L   CO2 28 20 - 32 mmol/L   Calcium 10.2 8.6 - 10.3 mg/dL   Total Protein 7.1 6.1 - 8.1 g/dL   Albumin 4.9 3.6 - 5.1 g/dL   Globulin 2.2 1.9 - 3.7 g/dL (calc)   AG Ratio 2.2 1.0 - 2.5 (calc)   Total Bilirubin 0.7 0.2 - 1.2 mg/dL   Alkaline phosphatase (APISO) 71 36 - 130 U/L   AST 12 10 - 40 U/L   ALT 11 9 - 46 U/L  Microalbumin / creatinine urine ratio     Status: Abnormal   Collection Time: 08/10/19 10:53 AM  Result Value Ref Range   Creatinine, Urine 334 (H) 20 - 320 mg/dL    Comment: Verified by repeat analysis. .    Microalb, Ur 65.9 mg/dL    Comment: Verified by repeat analysis. Marland Kitchen. Reference Range Not established    Microalb Creat Ratio 197 (H) <30 mcg/mg creat    Comment: . The ADA defines abnormalities in albumin excretion as follows: Marland Kitchen. Category         Result (mcg/mg creatinine) . Normal                    <30 Microalbuminuria         30-299  Clinical albuminuria   > OR = 300 . The ADA recommends that at least two of three specimens collected within a 3-6 month period be abnormal before considering a  patient to be within a diagnostic category.   Lipid panel     Status: None   Collection Time: 08/10/19 10:53 AM  Result Value Ref Range   Cholesterol 137 <200 mg/dL   HDL 47 > OR = 40 mg/dL   Triglycerides 58 <147<150 mg/dL   LDL Cholesterol (Calc) 77 mg/dL (calc)    Comment: Reference range: <100 . Desirable range <100 mg/dL for primary prevention;   <70 mg/dL for patients with CHD or diabetic patients  with > or = 2 CHD risk factors. Marland Kitchen. LDL-C is now calculated using the Martin-Hopkins  calculation, which is a validated novel method providing  better accuracy than the Friedewald equation in the  estimation of LDL-C.  Horald PollenMartin SS et al. Lenox AhrJAMA. 8295;621(302013;310(19): 2061-2068  (http://education.[facility].com/faq/FAQ164)    Total CHOL/HDL Ratio 2.9 <5.0 (calc)   Non-HDL Cholesterol (Calc) 90 <865<130 mg/dL (calc)    Comment: For patients with diabetes plus 1 major ASCVD risk  factor, treating to a non-HDL-C goal of <100 mg/dL  (LDL-C of <78<70 mg/dL) is considered a therapeutic  option.   TSH     Status: None   Collection Time: 08/10/19 10:53 AM  Result Value Ref Range   TSH 0.94 0.40 - 4.50 mIU/L  T4, free     Status: None   Collection Time: 08/10/19 10:53 AM  Result Value Ref Range   Free T4 1.3 0.8 - 1.8 ng/dL  HgB [id number]     Status: Abnormal   Collection Time: 10/19/19 10:10 AM  Result Value Ref Range   Hemoglobin A1C 7.1 (A) 4.0 - 5.6 %   HbA1c POC (<> result, manual entry)     HbA1c, POC (prediabetic range)     HbA1c, POC (controlled diabetic range)     CMP Latest Ref Rng & Units 08/10/2019 09/15/2018 11/30/2017  Glucose 65 - 99 mg/dL 629(B125(H) 284(X159(H) 324(M194(H)  BUN 7 - 25 mg/dL 12 12 11   Creatinine 0.60 - 1.35 mg/dL 0.100.72 2.720.78 5.360.83  Sodium 135 -  146 mmol/L 140 140 135  Potassium 3.5 - 5.3 mmol/L 4.4 3.9 4.6  Chloride 98 - 110 mmol/L 105 105 101  CO2 20 - 32 mmol/L 28 29 27   Calcium 8.6 - 10.3 mg/dL 10.4(H) 9.9  Total Protein 6.1 - 8.1 g/dL 7.1 7.0 6.7  Total Bilirubin 0.2 - 1.2  mg/dL 0.7 0.9 0.9  Alkaline Phos 48 - 230 U/L - - -  AST 10 - 40 U/L 12 9(L) 11  ALT 9 - 46 U/L 11 12 14      Results for Ray, Espinoza (MRN ) as of 04/20/2019 10:10  Ref. Range 11/14/2009 04:30 12/15/2015 10:03  TSH Latest Ref Range: 0.50 - 4.30 mIU/L 2.350 2.60  T4,Free(Direct) Latest Ref Range: 0.8 - 1.4 ng/dL 11/16/2009 1.2      Assessment & Plan:   1. Uncontrolled type 1 diabetes mellitus   - Patient has currently uncontrolled symptomatic type 1 DM since  24 years of age.  He presents for his visit today without any logs.  He does bring his meter, however it only has limited readings as it is his backup meter.  His coworker accidentally threw away his previous meter.  He reports some rare episodes of hypoglycemia.  His POCT A1C today is 7.1%, improving from 8.2% on last visit.  - I reviewed his most recent labs with him.   He does not report any gross competitions of diabetes, however, patient remains at a high risk for more acute and chronic complications of diabetes which include CAD, CVA, CKD, retinopathy, and neuropathy. These are all discussed in detail with the patient.  - The patient admits there is a room for improvement in their diet and drink choices. -  Suggestion is made for the patient to avoid simple carbohydrates from their diet including Cakes, Sweet Desserts / Pastries, Ice Cream, Soda (diet and regular), Sweet Tea, Candies, Chips, Cookies, Sweet Pastries,  Store Bought Juices, Alcohol in Excess of  1-2 drinks a day, Artificial Sweeteners, Coffee Creamer, and "Sugar-free" Products. This will help patient to have stable blood glucose profile and potentially avoid unintended weight gain.   - I encouraged the patient to switch to  unprocessed or minimally processed complex starch and increased protein intake (animal or plant source), fruits, and vegetables.   - Patient is advised to stick to a routine mealtimes to eat 3 meals  a day and avoid unnecessary  snacks ( to snack only to correct hypoglycemia).  - I have approached patient with the following individualized plan to manage diabetes and patient agrees:   -Once again, I had a discussion with him to engage better in self-care for diabetes.   -Given his recent improvement in his A1C, will keep same diabetes management plan.  He is advised to continue Lantus 30 units SQ daily at bedtime and Novolog 10-16 units three times a day with meals if blood glucose is above 90 and eating.  Specific instructions on titrating dose of insulin up based on blood glucose given to patient in writing.  -He is advised to continue monitoring blood glucose at least 4 times per day, before meals and at bedtime and call the clinic if blood glucose is less than 70 or greater than 200 for 3 tests in a row.  Previously, he was not eligible for a CGM due to cost.  He has a new job now that offers benefits and wants to pursue CGM once benefits are available to him.  Sample Dexcom G6  given to patient today.   - Insulin is the exclusive choice of therapy for his diabetes.  - Patient specific target  A1c;  LDL, HDL, Triglycerides, and  Waist Circumference were discussed in detail.  2) BP/HTN:  His blood pressure is controlled to target without the use of antihypertensive medications.  3) Lipids/HPL:  His most recent lipid panel from 07/2019 shows controlled LDL of 77.  He is not any cholesterol lowering agents at this time.   4)  Weight/Diet:  His Body mass index is 22.98 kg/m.He is not a candidate for weight loss. CDE Consult is initiated, exercise, and detailed carbohydrates information provided.  5) Chronic Care/Health Maintenance: -Patient is encouraged to continue to follow up with Ophthalmology, Podiatrist at least yearly or according to recommendations, and advised to   stay away from smoking. I have recommended yearly flu vaccine and pneumonia vaccination at least every 5 years; moderate intensity exercise for up  to 150 minutes weekly; and  sleep for at least 7 hours a day.  - I advised patient to maintain close follow up with Assunta Found, MD for primary care needs.  - Time spent on this patient care encounter:  35 min, of which > 50% was spent in  counseling and the rest reviewing his blood glucose logs , discussing his hypoglycemia and hyperglycemia episodes, reviewing his current and  previous labs / studies  ( including abstraction from other facilities) and medications  doses and developing a  long term treatment plan and documenting his care.   Please refer to Patient Instructions for Blood Glucose Monitoring and Insulin/Medications Dosing Guide"  in media tab for additional information. Please  also refer to " Patient Self Inventory" in the Media  tab for reviewed elements of pertinent patient history.  Raoul Pitch Neuberger participated in the discussions, expressed understanding, and voiced agreement with the above plans.  All questions were answered to his satisfaction. he is encouraged to contact clinic should he have any questions or concerns prior to his return visit.  Follow up plan: - Return in about 4 months (around 02/18/2020) for Diabetes follow up, Previsit labs, Virtual visit ok.  Ronny Bacon, FNP-BC [facility] Phone: [phone] Fax: [phone]  10/19/2019, 10:30 AM

## 2019-10-22 ENCOUNTER — Other Ambulatory Visit: Payer: Self-pay | Admitting: "Endocrinology

## 2019-12-01 ENCOUNTER — Other Ambulatory Visit: Payer: Self-pay | Admitting: "Endocrinology

## 2019-12-31 ENCOUNTER — Other Ambulatory Visit: Payer: Self-pay | Admitting: "Endocrinology

## 2020-02-29 ENCOUNTER — Ambulatory Visit: Payer: Medicaid Other | Admitting: Nurse Practitioner

## 2020-03-13 ENCOUNTER — Other Ambulatory Visit: Payer: Self-pay | Admitting: Nurse Practitioner

## 2020-04-12 ENCOUNTER — Other Ambulatory Visit: Payer: Self-pay | Admitting: Nurse Practitioner

## 2020-04-12 LAB — COMPREHENSIVE METABOLIC PANEL
ALT: 23 IU/L (ref 0–44)
AST: 17 IU/L (ref 0–40)
Albumin/Globulin Ratio: 1.8 (ref 1.2–2.2)
Albumin: 4.8 g/dL (ref 4.1–5.2)
Alkaline Phosphatase: 83 IU/L (ref 44–121)
BUN/Creatinine Ratio: 14 (ref 9–20)
BUN: 11 mg/dL (ref 6–20)
Bilirubin Total: 0.6 mg/dL (ref 0.0–1.2)
CO2: 19 mmol/L — ABNORMAL LOW (ref 20–29)
Calcium: 9.8 mg/dL (ref 8.7–10.2)
Chloride: 101 mmol/L (ref 96–106)
Creatinine, Ser: 0.8 mg/dL (ref 0.76–1.27)
GFR calc Af Amer: 147 mL/min/{1.73_m2} (ref >59)
GFR calc non Af Amer: 127 mL/min/{1.73_m2} (ref >59)
Globulin, Total: 2.6 g/dL (ref 1.5–4.5)
Glucose: 53 mg/dL — ABNORMAL LOW (ref 65–99)
Potassium: 4 mmol/L (ref 3.5–5.2)
Sodium: 142 mmol/L (ref 134–144)
Total Protein: 7.4 g/dL (ref 6.0–8.5)

## 2020-04-12 LAB — HEMOGLOBIN A1C
Est. average glucose Bld gHb Est-mCnc: 163 mg/dL
Hgb A1c MFr Bld: 7.3 % — ABNORMAL HIGH (ref 4.8–5.6)

## 2020-04-17 ENCOUNTER — Encounter: Payer: Self-pay | Admitting: Nurse Practitioner

## 2020-04-17 ENCOUNTER — Ambulatory Visit (INDEPENDENT_AMBULATORY_CARE_PROVIDER_SITE_OTHER): Payer: Medicaid Other | Admitting: Nurse Practitioner

## 2020-04-17 ENCOUNTER — Other Ambulatory Visit: Payer: Self-pay

## 2020-04-17 ENCOUNTER — Telehealth: Payer: Self-pay | Admitting: Nurse Practitioner

## 2020-04-17 VITALS — BP 142/77 | HR 78 | Ht 66.0 in | Wt 141.8 lb

## 2020-04-17 DIAGNOSIS — Z9119 Patient's noncompliance with other medical treatment and regimen: Secondary | ICD-10-CM | POA: Diagnosis not present

## 2020-04-17 DIAGNOSIS — E1065 Type 1 diabetes mellitus with hyperglycemia: Secondary | ICD-10-CM

## 2020-04-17 DIAGNOSIS — Z91199 Patient's noncompliance with other medical treatment and regimen due to unspecified reason: Secondary | ICD-10-CM

## 2020-04-17 MED ORDER — LANTUS SOLOSTAR 100 UNIT/ML ~~LOC~~ SOPN: 25.0000 [IU] | PEN_INJECTOR | Freq: Every day | SUBCUTANEOUS | 2 refills | Status: DC

## 2020-04-17 MED ORDER — NOVOLOG FLEXPEN 100 UNIT/ML ~~LOC~~ SOPN: 8.0000 [IU] | PEN_INJECTOR | Freq: Three times a day (TID) | SUBCUTANEOUS | 2 refills | Status: DC

## 2020-04-17 NOTE — Progress Notes (Signed)
04/17/2020       Endocrinology follow-up note   Subjective:    Patient ID: Ray Espinoza, male    DOB: 1996/02/09.  He is here to follow-up  for management of type 1 diabetes requested by  Sharilyn Sites, MD  Past Medical History:  Diagnosis Date  . Diabetes mellitus without complication ([place])    History reviewed. No pertinent surgical history. Social History   Socioeconomic History  . Marital status: Single    Spouse name: Not on file  . Number of children: Not on file  . Years of education: Not on file  . Highest education level: Not on file  Occupational History  . Not on file  Tobacco Use  . Smoking status: Current Every Day Smoker  . Smokeless tobacco: Never Used  Vaping Use  . Vaping Use: Never used  Substance and Sexual Activity  . Alcohol use: No  . Drug use: No  . Sexual activity: Not on file  Other Topics Concern  . Not on file  Social History Narrative  . Not on file   Social Determinants of Health   Financial Resource Strain: Not on file  Food Insecurity: Not on file  Transportation Needs: Not on file  Physical Activity: Not on file  Stress: Not on file  Social Connections: Not on file   Outpatient Encounter Medications as of 04/17/2020  Medication Sig  . GLOBAL EASE INJECT PEN NEEDLES 31G X 5 MM MISC USE AS DIRECTED FOUR TIMES DAILY  . glucose blood (ACCU-CHEK GUIDE) test strip Use as instructed  . [DISCONTINUED] insulin glargine (LANTUS SOLOSTAR) 100 UNIT/ML Solostar Pen INJECT 30 UNITS INTO THE SKIN AT BEDTIME  . [DISCONTINUED] NOVOLOG FLEXPEN 100 UNIT/ML FlexPen INJECT 10-16 UNITS UNDER THE SKIN THREE TIMES DAILY BEFORE MEALS  . insulin aspart (NOVOLOG FLEXPEN) 100 UNIT/ML FlexPen Inject 8-14 Units into the skin 3 (three) times daily with meals. If glucose is above 90 and he is eating.  Follow SSI  . insulin glargine (LANTUS SOLOSTAR) 100 UNIT/ML Solostar Pen Inject 25 Units into the skin at bedtime. INJECT 30 UNITS INTO THE SKIN AT BEDTIME    No facility-administered encounter medications on file as of 04/17/2020.   ALLERGIES: Allergies  Allergen Reactions  . Codeine Hives and Itching   VACCINATION STATUS:  There is no immunization history on file for this patient.  Diabetes He presents for his follow-up diabetic visit. He has type 1 diabetes mellitus. Onset time: He was diagnosed at approximate age of 25 years. His disease course has been stable. Hypoglycemia symptoms include nervousness/anxiousness, sweats and tremors. Pertinent negatives for hypoglycemia include no confusion, headaches, pallor or seizures. Pertinent negatives for diabetes include no chest pain, no fatigue, no polydipsia, no polyphagia, no polyuria and no weakness. Hypoglycemia complications include nocturnal hypoglycemia. Symptoms are stable. There are no diabetic complications. Risk factors for coronary artery disease include diabetes mellitus and male sex. Current diabetic treatment includes intensive insulin program. He is compliant with treatment most of the time. His weight is fluctuating minimally. He is following a generally unhealthy diet. When asked about meal planning, he reported none. He has not had a previous visit with a dietitian. He participates in exercise intermittently. His overall blood glucose range is 140-180 mg/dl. (He presents today with his meter (which does not show averages and does not record past the last few tests), but no logs.  He ran out of log sheets a few months back.  His previsit A1c was 7.3%, increasing  slightly from last visit of 7.1%.  He does report nocturnal hypoglycemia about 3 nights out of the week.  He does not always have symptoms of low glucose.) An ACE inhibitor/angiotensin II receptor blocker is contraindicated. He does not see a podiatrist.Eye exam is not current.    Review of systems  Constitutional: + Minimally fluctuating body weight,  current Body mass index is 22.89 kg/m. , no fatigue, no subjective  hyperthermia, no subjective hypothermia Eyes: no blurry vision, no xerophthalmia ENT: no sore throat, no nodules palpated in throat, no dysphagia/odynophagia, no hoarseness Cardiovascular: no chest pain, no shortness of breath, no palpitations, no leg swelling Respiratory: no cough, no shortness of breath Gastrointestinal: no nausea/vomiting/diarrhea Musculoskeletal: no muscle/joint aches Skin: no rashes, no hyperemia Neurological: no tremors, no numbness, no tingling, no dizziness Psychiatric: no depression, no anxiety   Objective:    BP (!) 142/77 (BP Location: Right Arm, Patient Position: Sitting)   Pulse 78   Ht _0  (1.676 m)   Wt 141 lb 12.8 oz (64.3 kg)   BMI 22.89 kg/m   Wt Readings from Last 3 Encounters:  04/17/20 141 lb 12.8 oz (64.3 kg)  10/19/19 142 lb 6.4 oz (64.6 kg)  04/20/19 148 lb (67.1 kg)    BP Readings from Last 3 Encounters:  04/17/20 (!) 142/77  10/19/19 135/83  04/20/19 133/82    Physical Exam- Limited  Constitutional:  Body mass index is 22.89 kg/m. , not in acute distress, normal state of mind Eyes:  EOMI, no exophthalmos Neck: Supple Cardiovascular: RRR, no murmers, rubs, or gallops, no edema Respiratory: Adequate breathing efforts, no crackles, rales, rhonchi, or wheezing Musculoskeletal: no gross deformities, strength intact in all four extremities, no gross restriction of joint movements Skin:  no rashes, no hyperemia Neurological: no tremor with outstretched hands    Diabetic Labs (most recent): Lab Results  Component Value Date   HGBA1C 7.3 (H) 04/10/2020   HGBA1C 7.1 (A) 10/19/2019   HGBA1C 8.2 (A) 04/20/2019   Recent Results (from the past 2160 hour(s))  Comprehensive metabolic panel     Status: Abnormal   Collection Time: 04/10/20  9:16 AM  Result Value Ref Range   Glucose 53 (L) 65 - 99 mg/dL   BUN 11 6 - 20 mg/dL   Creatinine, Ser 0.80 0.76 - 1.27 mg/dL   GFR calc non Af Amer 127 >59 mL/min/1.73   GFR calc Af Amer 147  >59 mL/min/1.73    Comment: **In accordance with recommendations from the NKF-ASN Task force,**   [facility] is in the process of updating its eGFR calculation to the   2021 CKD-EPI creatinine equation that estimates kidney function   without a race variable.    BUN/Creatinine Ratio 14 9 - 20   Sodium 142 134 - 144 mmol/L   Potassium 4.0 3.5 - 5.2 mmol/L   Chloride 101 96 - 106 mmol/L   CO2 19 (L) 20 - 29 mmol/L   Calcium 9.8 8.7 - 10.2 mg/dL   Total Protein 7.4 6.0 - 8.5 g/dL   Albumin 4.8 4.1 - 5.2 g/dL   Globulin, Total 2.6 1.5 - 4.5 g/dL   Albumin/Globulin Ratio 1.8 1.2 - 2.2   Bilirubin Total 0.6 0.0 - 1.2 mg/dL   Alkaline Phosphatase 83 44 - 121 IU/L   AST 17 0 - 40 IU/L   ALT 23 0 - 44 IU/L  Hemoglobin A1c     Status: Abnormal   Collection Time: 04/10/20  9:16 AM  Result  Value Ref Range   Hgb A1c MFr Bld 7.3 (H) 4.8 - 5.6 %    Comment:          Prediabetes: 5.7 - 6.4          Diabetes: >6.4          Glycemic control for adults with diabetes: <7.0    Est. average glucose Bld gHb Est-mCnc 163 mg/dL   CMP Latest Ref Rng & Units 04/10/2020 08/10/2019 09/15/2018  Glucose 65 - 99 mg/dL 53(L) 125(H) 159(H)  BUN 6 - 20 mg/dL _0 Creatinine 0.76 - 1.27 mg/dL 0.80 0.72 0.78  Sodium 134 - 144 mmol/L 142 140 140  Potassium 3.5 - 5.2 mmol/L 4.0 4.4 3.9  Chloride 96 - 106 mmol/L 101 105 105  CO2 20 - 29 mmol/L 19(L) 28 29  Calcium 8.7 - 10.2 mg/dL 9.8 10.2 10.4(H)  Total Protein 6.0 - 8.5 g/dL 7.4 7.1 7.0  Total Bilirubin 0.0 - 1.2 mg/dL 0.6 0.7 0.9  Alkaline Phos 44 - 121 IU/L 83 - -  AST 0 - 40 IU/L 17 12 9(L)  ALT 0 - 44 IU/L _1 Results for YOANDRI, CONGROVE (MRN [id number]) as of 04/20/2019 10:10  Ref. Range 11/14/2009 04:30 12/15/2015 10:03  TSH Latest Ref Range: 0.50 - 4.30 mIU/L 2.350 2.60  T4,Free(Direct) Latest Ref Range: 0.8 - 1.4 ng/dL 0.92 1.2      Assessment & Plan:   1) Uncontrolled type 1 diabetes mellitus   - Patient has currently  uncontrolled symptomatic type 1 DM since  24 years of age.  He presents today with his meter (which does not show averages and does not record past the last few tests), but no logs.  He ran out of log sheets a few months back.  His previsit A1c was 7.3%, increasing slightly from last visit of 7.1%.  He does report nocturnal hypoglycemia about 3 nights out of the week.  He does not always have symptoms of low glucose.  - I reviewed his most recent labs with him.    He does not report any gross competitions of diabetes, however, patient remains at a high risk for more acute and chronic complications of diabetes which include CAD, CVA, CKD, retinopathy, and neuropathy. These are all discussed in detail with the patient.  - Nutritional counseling repeated at each appointment due to patients tendency to fall back in to old habits.  - The patient admits there is a room for improvement in their diet and drink choices. -  Suggestion is made for the patient to avoid simple carbohydrates from their diet including Cakes, Sweet Desserts / Pastries, Ice Cream, Soda (diet and regular), Sweet Tea, Candies, Chips, Cookies, Sweet Pastries,  Store Bought Juices, Alcohol in Excess of  1-2 drinks a day, Artificial Sweeteners, Coffee Creamer, and "Sugar-free" Products. This will help patient to have stable blood glucose profile and potentially avoid unintended weight gain.   - I encouraged the patient to switch to  unprocessed or minimally processed complex starch and increased protein intake (animal or plant source), fruits, and vegetables.   - Patient is advised to stick to a routine mealtimes to eat 3 meals  a day and avoid unnecessary snacks ( to snack only to correct hypoglycemia).  - I have approached patient with the following individualized plan to manage diabetes and patient agrees:   -Given his reports of frequent nocturnal hypoglycemia, he will benefit from reduction of  his Lantus to 25 units SQ nightly.   Will also reduce his Novolog dose to 8-14 units SQ TID with meals if glucose is above 90 and he is eating.  Specific instructions on how to titrate insulin dose based on glucose readings given to patient.   -He is advised to continue monitoring blood glucose at least 4 times per day, before meals and at bedtime and call the clinic if blood glucose is less than 70 or greater than 200 for 3 tests in a row.  Previously, he was not eligible for a CGM due to cost.  He has a new job now that offers benefits and wants to pursue CGM once benefits are available to him.    - Insulin is the exclusive choice of therapy for his type 1 diabetes.  - Patient specific target  A1c;  LDL, HDL, Triglycerides, and  Waist Circumference were discussed in detail.  2) BP/HTN:  His blood pressure is controlled to target without the use of antihypertensive medications.  3) Lipids/HPL:  His most recent lipid panel from 07/2019 shows controlled LDL of 77.  He is not any cholesterol lowering agents at this time. Will recheck lipid panel prior to next visit.  4)  Weight/Diet:  His Body mass index is 22.89 kg/m.  He is not a candidate for weight loss. CDE Consult is initiated, exercise, and detailed carbohydrates information provided.  5) Chronic Care/Health Maintenance: -Patient is encouraged to continue to follow up with Ophthalmology, Podiatrist at least yearly or according to recommendations, and advised to stay away from smoking. I have recommended yearly flu vaccine and pneumonia vaccination at least every 5 years; moderate intensity exercise for up to 150 minutes weekly; and  sleep for at least 7 hours a day.  - I advised patient to maintain close follow up with Sharilyn Sites, MD for primary care needs.  - Time spent on this patient care encounter:  40 min, of which > 50% was spent in  counseling and the rest reviewing his blood glucose logs , discussing his hypoglycemia and hyperglycemia episodes, reviewing his  current and  previous labs / studies  ( including abstraction from other facilities) and medications  doses and developing a  long term treatment plan and documenting his care.   Please refer to Patient Instructions for Blood Glucose Monitoring and Insulin/Medications Dosing Guide"  in media tab for additional information. Please  also refer to " Patient Self Inventory" in the Media  tab for reviewed elements of pertinent patient history.  Margretta Sidle Braithwaite participated in the discussions, expressed understanding, and voiced agreement with the above plans.  All questions were answered to his satisfaction. he is encouraged to contact clinic should he have any questions or concerns prior to his return visit.  Follow up plan: - Return for Diabetes follow up- A1c and urine micro in office, Previsit labs, Bring glucometer and logs.  Rayetta Pigg, [facility] [address] [place], [place] [address] Phone: [phone] Fax: [phone] 04/17/2020, 2:05 PM

## 2020-04-17 NOTE — Telephone Encounter (Signed)
I reduced him to 25 units nightly due to frequency of nocturnal hypoglycemia

## 2020-04-17 NOTE — Telephone Encounter (Signed)
Maralyn Sago called from [facility] and is needing clarification for lantus that was called in [phone]

## 2020-04-17 NOTE — Telephone Encounter (Signed)
Was he being increased ? Your office note isn't done yet. Please advise

## 2020-04-17 NOTE — Telephone Encounter (Signed)
Thanks, I changed the script because it said 30 In one spot and 25 in the other, Advised the [employer] as well

## 2020-04-17 NOTE — Patient Instructions (Signed)

## 2020-04-17 NOTE — Telephone Encounter (Signed)
Thank you so much

## 2020-08-18 ENCOUNTER — Ambulatory Visit: Payer: Medicaid Other | Admitting: Nurse Practitioner

## 2020-08-20 ENCOUNTER — Other Ambulatory Visit: Payer: Self-pay | Admitting: Nurse Practitioner

## 2020-09-04 LAB — T4, FREE: Free T4: 1.44 ng/dL (ref 0.82–1.77)

## 2020-09-04 LAB — LIPID PANEL
Chol/HDL Ratio: 2.7 ratio (ref 0.0–5.0)
Cholesterol, Total: 134 mg/dL (ref 100–199)
HDL: 50 mg/dL (ref >39)
LDL Chol Calc (NIH): 68 mg/dL (ref 0–99)
Triglycerides: 83 mg/dL (ref 0–149)
VLDL Cholesterol Cal: 16 mg/dL (ref 5–40)

## 2020-09-04 LAB — TSH: TSH: 5.48 u[IU]/mL — ABNORMAL HIGH (ref 0.450–4.500)

## 2020-09-08 ENCOUNTER — Ambulatory Visit: Payer: Medicaid Other | Admitting: Nurse Practitioner

## 2020-09-09 ENCOUNTER — Ambulatory Visit: Payer: Medicaid Other | Admitting: Nurse Practitioner

## 2020-09-17 NOTE — Patient Instructions (Signed)
Diabetes Mellitus and Nutrition, Adult When you have diabetes, or diabetes mellitus, it is very important to have healthy eating habits because your blood sugar (glucose) levels are greatly affected by what you eat and drink. Eating healthy foods in the right amounts, at about the same times every day, can help you:  Control your blood glucose.  Lower your risk of heart disease.  Improve your blood pressure.  Reach or maintain a healthy weight. What can affect my meal plan? Every person with diabetes is different, and each person has different needs for a meal plan. Your health care provider may recommend that you work with a dietitian to make a meal plan that is best for you. Your meal plan may vary depending on factors such as:  The calories you need.  The medicines you take.  Your weight.  Your blood glucose, blood pressure, and cholesterol levels.  Your activity level.  Other health conditions you have, such as heart or kidney disease. How do carbohydrates affect me? Carbohydrates, also called carbs, affect your blood glucose level more than any other type of food. Eating carbs naturally raises the amount of glucose in your blood. Carb counting is a method for keeping track of how many carbs you eat. Counting carbs is important to keep your blood glucose at a healthy level, especially if you use insulin or take certain oral diabetes medicines. It is important to know how many carbs you can safely have in each meal. This is different for every person. Your dietitian can help you calculate how many carbs you should have at each meal and for each snack. How does alcohol affect me? Alcohol can cause a sudden decrease in blood glucose (hypoglycemia), especially if you use insulin or take certain oral diabetes medicines. Hypoglycemia can be a life-threatening condition. Symptoms of hypoglycemia, such as sleepiness, dizziness, and confusion, are similar to symptoms of having too much  alcohol.  Do not drink alcohol if: ? Your health care provider tells you not to drink. ? You are pregnant, may be pregnant, or are planning to become pregnant.  If you drink alcohol: ? Do not drink on an empty stomach. ? Limit how much you use to:  0-1 drink a day for women.  0-2 drinks a day for men. ? Be aware of how much alcohol is in your drink. In the [place] one drink equals one 12 oz bottle of beer (355 mL), one 5 oz glass of wine (148 mL), or one 1 oz glass of hard liquor (44 mL). ? Keep yourself hydrated with water, diet soda, or unsweetened iced tea.  Keep in mind that regular soda, juice, and other mixers may contain a lot of sugar and must be counted as carbs. What are tips for following this plan? Reading food labels  Start by checking the serving size on the "Nutrition Facts" label of packaged foods and drinks. The amount of calories, carbs, fats, and other nutrients listed on the label is based on one serving of the item. Many items contain more than one serving per package.  Check the total grams (g) of carbs in one serving. You can calculate the number of servings of carbs in one serving by dividing the total carbs by 15. For example, if a food has 30 g of total carbs per serving, it would be equal to 2 servings of carbs.  Check the number of grams (g) of saturated fats and trans fats in one serving. Choose foods that have   a low amount or none of these fats.  Check the number of milligrams (mg) of salt (sodium) in one serving. Most people should limit total sodium intake to less than 2,300 mg per day.  Always check the nutrition information of foods labeled as "low-fat" or "nonfat." These foods may be higher in added sugar or refined carbs and should be avoided.  Talk to your dietitian to identify your daily goals for nutrients listed on the label. Shopping  Avoid buying canned, pre-made, or processed foods. These foods tend to be high in fat, sodium, and added  sugar.  Shop around the outside edge of the grocery store. This is where you will most often find fresh fruits and vegetables, bulk grains, fresh meats, and fresh dairy. Cooking  Use low-heat cooking methods, such as baking, instead of high-heat cooking methods like deep frying.  Cook using healthy oils, such as olive, canola, or sunflower oil.  Avoid cooking with butter, cream, or high-fat meats. Meal planning  Eat meals and snacks regularly, preferably at the same times every day. Avoid going long periods of time without eating.  Eat foods that are high in fiber, such as fresh fruits, vegetables, beans, and whole grains. Talk with your dietitian about how many servings of carbs you can eat at each meal.  Eat 4-6 oz (112-168 g) of lean protein each day, such as lean meat, chicken, fish, eggs, or tofu. One ounce (oz) of lean protein is equal to: ? 1 oz (28 g) of meat, chicken, or fish. ? 1 egg. ?  cup (62 g) of tofu.  Eat some foods each day that contain healthy fats, such as avocado, nuts, seeds, and fish.   What foods should I eat? Fruits Berries. Apples. Oranges. Peaches. Apricots. Plums. Grapes. Mango. Papaya. Pomegranate. Kiwi. Cherries. Vegetables Lettuce. Spinach. Leafy greens, including kale, chard, collard greens, and mustard greens. Beets. Cauliflower. Cabbage. Broccoli. Carrots. Green beans. Tomatoes. Peppers. Onions. Cucumbers. Brussels sprouts. Grains Whole grains, such as whole-wheat or whole-grain bread, crackers, tortillas, cereal, and pasta. Unsweetened oatmeal. Quinoa. Brown or wild rice. Meats and other proteins Seafood. Poultry without skin. Lean cuts of poultry and beef. Tofu. Nuts. Seeds. Dairy Low-fat or fat-free dairy products such as milk, yogurt, and cheese. The items listed above may not be a complete list of foods and beverages you can eat. Contact a dietitian for more information. What foods should I avoid? Fruits Fruits canned with  syrup. Vegetables Canned vegetables. Frozen vegetables with butter or cream sauce. Grains Refined white flour and flour products such as bread, pasta, snack foods, and cereals. Avoid all processed foods. Meats and other proteins Fatty cuts of meat. Poultry with skin. Breaded or fried meats. Processed meat. Avoid saturated fats. Dairy Full-fat yogurt, cheese, or milk. Beverages Sweetened drinks, such as soda or iced tea. The items listed above may not be a complete list of foods and beverages you should avoid. Contact a dietitian for more information. Questions to ask a health care provider  Do I need to meet with a diabetes educator?  Do I need to meet with a dietitian?  What number can I call if I have questions?  When are the best times to check my blood glucose? Where to find more information:  American Diabetes Association: diabetes.org  Academy of Nutrition and Dietetics: www.eatright.org  National Institute of Diabetes and Digestive and Kidney Diseases: www.niddk.nih.gov  Association of Diabetes Care and Education Specialists: www.diabeteseducator.org Summary  It is important to have healthy eating   habits because your blood sugar (glucose) levels are greatly affected by what you eat and drink.  A healthy meal plan will help you control your blood glucose and maintain a healthy lifestyle.  Your health care provider may recommend that you work with a dietitian to make a meal plan that is best for you.  Keep in mind that carbohydrates (carbs) and alcohol have immediate effects on your blood glucose levels. It is important to count carbs and to use alcohol carefully. This information is not intended to replace advice given to you by your health care provider. Make sure you discuss any questions you have with your health care provider. Document Revised: 01/16/2019 Document Reviewed: 01/16/2019 Elsevier Patient Education  2021 [employer]  

## 2020-09-18 ENCOUNTER — Ambulatory Visit (INDEPENDENT_AMBULATORY_CARE_PROVIDER_SITE_OTHER): Payer: Medicaid Other | Admitting: Nurse Practitioner

## 2020-09-18 ENCOUNTER — Encounter: Payer: Self-pay | Admitting: Nurse Practitioner

## 2020-09-18 ENCOUNTER — Other Ambulatory Visit: Payer: Self-pay

## 2020-09-18 VITALS — BP 120/78 | HR 88 | Ht 66.0 in | Wt 141.8 lb

## 2020-09-18 DIAGNOSIS — E1065 Type 1 diabetes mellitus with hyperglycemia: Secondary | ICD-10-CM

## 2020-09-18 DIAGNOSIS — Z9119 Patient's noncompliance with other medical treatment and regimen: Secondary | ICD-10-CM | POA: Diagnosis not present

## 2020-09-18 DIAGNOSIS — Z91199 Patient's noncompliance with other medical treatment and regimen due to unspecified reason: Secondary | ICD-10-CM

## 2020-09-18 LAB — POCT GLYCOSYLATED HEMOGLOBIN (HGB A1C): HbA1c, POC (controlled diabetic range): 7.6 % — AB (ref 0.0–7.0)

## 2020-09-18 MED ORDER — LANTUS SOLOSTAR 100 UNIT/ML ~~LOC~~ SOPN: 25.0000 [IU] | PEN_INJECTOR | Freq: Every day | SUBCUTANEOUS | 2 refills | Status: DC

## 2020-09-18 MED ORDER — NOVOLOG FLEXPEN 100 UNIT/ML ~~LOC~~ SOPN: 8.0000 [IU] | PEN_INJECTOR | Freq: Three times a day (TID) | SUBCUTANEOUS | 3 refills | Status: DC

## 2020-09-18 NOTE — Progress Notes (Signed)
09/18/2020       Endocrinology follow-up note   Subjective:    Patient ID: Ray Espinoza, male    DOB: 10-14-1995.  He is here to follow-up  for management of type 1 diabetes requested by  Assunta FoundGolding, John, MD  Past Medical History:  Diagnosis Date   Diabetes mellitus without complication [facility](HCC)    History reviewed. No pertinent surgical history. Social History   Socioeconomic History   Marital status: Single    Spouse name: Not on file   Number of children: Not on file   Years of education: Not on file   Highest education level: Not on file  Occupational History   Not on file  Tobacco Use   Smoking status: Former    Types: Cigarettes   Smokeless tobacco: Never  Vaping Use   Vaping Use: Never used  Substance and Sexual Activity   Alcohol use: No   Drug use: No   Sexual activity: Not on file  Other Topics Concern   Not on file  Social History Narrative   Not on file   Social Determinants of Health   Financial Resource Strain: Not on file  Food Insecurity: Not on file  Transportation Needs: Not on file  Physical Activity: Not on file  Stress: Not on file  Social Connections: Not on file   Outpatient Encounter Medications as of 09/18/2020  Medication Sig   GLOBAL EASE INJECT PEN NEEDLES 31G X 5 MM MISC USE AS DIRECTED FOUR TIMES DAILY   glucose blood (ACCU-CHEK GUIDE) test strip Use as instructed   insulin aspart (NOVOLOG FLEXPEN) 100 UNIT/ML FlexPen Inject 8-14 Units into the skin 3 (three) times daily with meals. If glucose is above 70 and he is eating.  Follow SSI   insulin glargine (LANTUS SOLOSTAR) 100 UNIT/ML Solostar Pen Inject 25 Units into the skin at bedtime.   [DISCONTINUED] insulin aspart (NOVOLOG FLEXPEN) 100 UNIT/ML FlexPen Inject 8-14 Units into the skin 3 (three) times daily with meals. If glucose is above 90 and he is eating.  Follow SSI   [DISCONTINUED] insulin glargine (LANTUS SOLOSTAR) 100 UNIT/ML Solostar Pen Inject 25 Units into the skin at  bedtime.   No facility-administered encounter medications on file as of 09/18/2020.   ALLERGIES: Allergies  Allergen Reactions   Codeine Hives and Itching   VACCINATION STATUS:  There is no immunization history on file for this patient.  Diabetes He presents for his follow-up diabetic visit. He has type 1 diabetes mellitus. Onset time: He was diagnosed at approximate age of 14 years. His disease course has been worsening. There are no hypoglycemic associated symptoms. Pertinent negatives for hypoglycemia include no confusion, headaches, pallor or seizures. Pertinent negatives for diabetes include no chest pain, no fatigue, no polydipsia, no polyphagia, no polyuria and no weakness. There are no hypoglycemic complications. Symptoms are stable. There are no diabetic complications. Risk factors for coronary artery disease include diabetes mellitus and male sex. Current diabetic treatment includes intensive insulin program. He is compliant with treatment most of the time. His weight is fluctuating minimally. He is following a generally unhealthy diet. When asked about meal planning, he reported none. He has not had a previous visit with a dietitian. He participates in exercise intermittently. His home blood glucose trend is fluctuating minimally. His breakfast blood glucose range is generally 110-130 mg/dl. His lunch blood glucose range is generally 140-180 mg/dl. His dinner blood glucose range is generally 180-200 mg/dl. (He presents today with his meter and  logs showing stable glycemic profile with near target fasting and above target postprandial glycemic profile.  His POCT A1c today is 7.6%, increasing from last visit of 7.3%.  He does report less hypoglycemia than before.) An ACE inhibitor/angiotensin II receptor blocker is contraindicated. He does not see a podiatrist.Eye exam is not current.   Review of systems  Constitutional: + Minimally fluctuating body weight,  current Body mass index is 22.89  kg/m. , no fatigue, no subjective hyperthermia, no subjective hypothermia Eyes: no blurry vision, no xerophthalmia ENT: no sore throat, no nodules palpated in throat, no dysphagia/odynophagia, no hoarseness Cardiovascular: no chest pain, no shortness of breath, no palpitations, no leg swelling Respiratory: no cough, no shortness of breath Gastrointestinal: no nausea/vomiting/diarrhea Musculoskeletal: no muscle/joint aches Skin: no rashes, no hyperemia Neurological: no tremors, no numbness, no tingling, no dizziness Psychiatric: no depression, no anxiety   Objective:    BP 120/78   Pulse 88   Ht 5\' 6"  (1.676 m)   Wt 141 lb 12.8 oz (64.3 kg)   BMI 22.89 kg/m   Wt Readings from Last 3 Encounters:  09/18/20 141 lb 12.8 oz (64.3 kg)  04/17/20 141 lb 12.8 oz (64.3 kg)  10/19/19 142 lb 6.4 oz (64.6 kg)    BP Readings from Last 3 Encounters:  09/18/20 120/78  04/17/20 (!) 142/77  10/19/19 135/83     Physical Exam- Limited  Constitutional:  Body mass index is 22.89 kg/m. , not in acute distress, normal state of mind Eyes:  EOMI, no exophthalmos Neck: Supple Cardiovascular: RRR, no murmurs, rubs, or gallops, no edema Respiratory: Adequate breathing efforts, no crackles, rales, rhonchi, or wheezing Musculoskeletal: no gross deformities, strength intact in all four extremities, no gross restriction of joint movements Skin:  no rashes, no hyperemia Neurological: no tremor with outstretched hands    Diabetic Labs (most recent): Lab Results  Component Value Date   HGBA1C 7.6 (A) 09/18/2020   HGBA1C 7.3 (H) 04/10/2020   HGBA1C 7.1 (A) 10/19/2019   Recent Results (from the past 2160 hour(s))  Lipid panel     Status: None   Collection Time: 09/03/20  8:06 AM  Result Value Ref Range   Cholesterol, Total 134 100 - 199 mg/dL   Triglycerides 83 0 - 149 mg/dL   HDL 50 09/05/20 mg/dL   VLDL Cholesterol Cal 16 5 - 40 mg/dL   LDL Chol Calc (NIH) 68 0 - 99 mg/dL   Chol/HDL Ratio 2.7  0.0 - 5.0 ratio    Comment:                                   T. Chol/HDL Ratio                                             Men  Women                               1/2 Avg.Risk  3.4    3.3                                   Avg.Risk  5.0    4.4  2X Avg.Risk  9.6    7.1                                3X Avg.Risk 23.4   11.0   T4, free     Status: None   Collection Time: 09/03/20  8:06 AM  Result Value Ref Range   Free T4 1.44 0.82 - 1.77 ng/dL  TSH     Status: Abnormal   Collection Time: 09/03/20  8:06 AM  Result Value Ref Range   TSH 5.480 (H) 0.450 - 4.500 uIU/mL  HgB A1c     Status: Abnormal   Collection Time: 09/18/20 10:26 AM  Result Value Ref Range   Hemoglobin A1C     HbA1c POC (<> result, manual entry)     HbA1c, POC (prediabetic range)     HbA1c, POC (controlled diabetic range) 7.6 (A) 0.0 - 7.0 %   CMP Latest Ref Rng & Units 04/10/2020 08/10/2019 09/15/2018  Glucose 65 - 99 mg/dL 62(E) 366(Q) 947(M)  BUN 6 - 20 mg/dL 11 12 12   Creatinine 0.76 - 1.27 mg/dL 5.46 5.03  Sodium 134 - 144 mmol/L 142 140 140  Potassium 3.5 - 5.2 mmol/L 4.0 4.4 3.9  Chloride 96 - 106 mmol/L 101 105 105  CO2 20 - 29 mmol/L 19(L) 28 29  Calcium 8.7 - 10.2 mg/dL 9.8 5.46 10.4(H)  Total Protein 6.0 - 8.5 g/dL 7.4 7.1 7.0  Total Bilirubin 0.0 - 1.2 mg/dL 0.6 0.7 0.9  Alkaline Phos 44 - 121 IU/L 83 - -  AST 0 - 40 IU/L 17 12 9(L)  ALT 0 - 44 IU/L 23 11 12      Results for TOBECHUKWU, EMMICK (MRN ) as of 04/20/2019 10:10  Ref. Range 11/14/2009 04:30 12/15/2015 10:03  TSH Latest Ref Range: 0.50 - 4.30 mIU/L 2.350 2.60  T4,Free(Direct) Latest Ref Range: 0.8 - 1.4 ng/dL 11/16/2009 1.2      Assessment & Plan:   1) Uncontrolled type 1 diabetes mellitus   - Patient has currently uncontrolled symptomatic type 1 DM since  25 years of age.  He presents today with his meter and logs showing stable glycemic profile with near target fasting and above target  postprandial glycemic profile.  His POCT A1c today is 7.6%, increasing from last visit of 7.3%.  He does report less hypoglycemia than before.  - I reviewed his most recent labs with him.    He does not report any gross competitions of diabetes, however, patient remains at a high risk for more acute and chronic complications of diabetes which include CAD, CVA, CKD, retinopathy, and neuropathy. These are all discussed in detail with the patient.  - Nutritional counseling repeated at each appointment due to patients tendency to fall back in to old habits.  - The patient admits there is a room for improvement in their diet and drink choices. -  Suggestion is made for the patient to avoid simple carbohydrates from their diet including Cakes, Sweet Desserts / Pastries, Ice Cream, Soda (diet and regular), Sweet Tea, Candies, Chips, Cookies, Sweet Pastries, Store Bought Juices, Alcohol in Excess of 1-2 drinks a day, Artificial Sweeteners, Coffee Creamer, and "Sugar-free" Products. This will help patient to have stable blood glucose profile and potentially avoid unintended weight gain.   - I encouraged the patient to switch to unprocessed or minimally processed complex starch and increased protein intake (animal or plant source),  fruits, and vegetables.   - Patient is advised to stick to a routine mealtimes to eat 3 meals a day and avoid unnecessary snacks (to snack only to correct hypoglycemia).  - I have approached patient with the following individualized plan to manage diabetes and patient agrees:   -Based on his stable glycemic profile and less hypoglycemia, he is advised to continue his current dose of Lantus at 25 units SQ nightly.  He is advised to continue his Novolog 8-14 units TID with meals if glucose is above 70 and he is eating (this will allow him to inject more frequently and avoid posptrandial highs.   -He is advised to continue monitoring blood glucose at least 4 times per day, before  meals and at bedtime and call the clinic if blood glucose is less than 70 or greater than 200 for 3 tests in a row.    - Insulin is the exclusive choice of therapy for his type 1 diabetes.  - Patient specific target  A1c;  LDL, HDL, Triglycerides, and  Waist Circumference were discussed in detail.  2) BP/HTN:  His blood pressure is controlled to target without the use of antihypertensive medications.  3) Lipids/HPL:  His most recent lipid panel from 09/03/20 shows controlled LDL of 68.  He is not any cholesterol lowering agents at this time.   4)  Weight/Diet:  His Body mass index is 22.89 kg/m.  He is not a candidate for weight loss. CDE Consult is initiated, exercise, and detailed carbohydrates information provided.  5) Chronic Care/Health Maintenance: -Patient is encouraged to continue to follow up with Ophthalmology, Podiatrist at least yearly or according to recommendations, and advised to stay away from smoking. I have recommended yearly flu vaccine and pneumonia vaccination at least every 5 years; moderate intensity exercise for up to 150 minutes weekly; and  sleep for at least 7 hours a day.  - I advised patient to maintain close follow up with Assunta Found, MD for primary care needs.    I spent 40 minutes in the care of the patient today including review of labs from CMP, Lipids, Thyroid Function, Hematology (current and previous including abstractions from other facilities); face-to-face time discussing  his blood glucose readings/logs, discussing hypoglycemia and hyperglycemia episodes and symptoms, medications doses, his options of short and long term treatment based on the latest standards of care / guidelines;  discussion about incorporating lifestyle medicine;  and documenting the encounter.    Please refer to Patient Instructions for Blood Glucose Monitoring and Insulin/Medications Dosing Guide"  in media tab for additional information. Please  also refer to " Patient Self  Inventory" in the Media  tab for reviewed elements of pertinent patient history.  Ray Espinoza participated in the discussions, expressed understanding, and voiced agreement with the above plans.  All questions were answered to his satisfaction. he is encouraged to contact clinic should he have any questions or concerns prior to his return visit.  Follow up plan: - Return in about 3 months (around 12/19/2020) for Diabetes F/U with A1c in office, No previsit labs, Bring meter and logs.  Ray Espinoza, [facility] [address] [place], [place] [address] Phone: [phone] Fax: [phone]   09/18/2020, 11:46 AM

## 2020-12-13 ENCOUNTER — Other Ambulatory Visit: Payer: Self-pay | Admitting: Nurse Practitioner

## 2021-01-19 ENCOUNTER — Ambulatory Visit (INDEPENDENT_AMBULATORY_CARE_PROVIDER_SITE_OTHER): Payer: Medicaid Other | Admitting: Nurse Practitioner

## 2021-01-19 ENCOUNTER — Encounter: Payer: Self-pay | Admitting: Nurse Practitioner

## 2021-01-19 VITALS — BP 136/78 | HR 98 | Ht 66.0 in | Wt 145.0 lb

## 2021-01-19 DIAGNOSIS — E1065 Type 1 diabetes mellitus with hyperglycemia: Secondary | ICD-10-CM | POA: Diagnosis not present

## 2021-01-19 LAB — POCT GLYCOSYLATED HEMOGLOBIN (HGB A1C): HbA1c POC (<> result, manual entry): 7.2 % (ref 4.0–5.6)

## 2021-01-19 MED ORDER — DEXCOM G6 TRANSMITTER MISC: 3 refills | Status: DC

## 2021-01-19 MED ORDER — DEXCOM G6 SENSOR MISC: 3 refills | Status: DC

## 2021-01-19 NOTE — Patient Instructions (Signed)

## 2021-01-19 NOTE — Progress Notes (Signed)
01/19/2021       Endocrinology follow-up note   Subjective:    Patient ID: Ray Espinoza, male    DOB: Aug 25, 1995.  Ray Espinoza is here to follow-up for management of type 1 diabetes requested by  Assunta Found, MD  Past Medical History:  Diagnosis Date   Diabetes mellitus without complication [facility])    History reviewed. No pertinent surgical history. Social History   Socioeconomic History   Marital status: Single    Spouse name: Not on file   Number of children: Not on file   Years of education: Not on file   Highest education level: Not on file  Occupational History   Not on file  Tobacco Use   Smoking status: Former    Types: Cigarettes   Smokeless tobacco: Never  Vaping Use   Vaping Use: Never used  Substance and Sexual Activity   Alcohol use: No   Drug use: No   Sexual activity: Not on file  Other Topics Concern   Not on file  Social History Narrative   Not on file   Social Determinants of Health   Financial Resource Strain: Not on file  Food Insecurity: Not on file  Transportation Needs: Not on file  Physical Activity: Not on file  Stress: Not on file  Social Connections: Not on file   Outpatient Encounter Medications as of 01/19/2021  Medication Sig   Continuous Blood Gluc Sensor (DEXCOM G6 SENSOR) MISC Change sensor every 10 days as directed   Continuous Blood Gluc Transmit (DEXCOM G6 TRANSMITTER) MISC Change transmitter every 90 days as directed.   GLOBAL EASE INJECT PEN NEEDLES 31G X 5 MM MISC USE AS DIRECTED FOUR TIMES DAILY   glucose blood (ACCU-CHEK GUIDE) test strip Use as instructed   insulin aspart (NOVOLOG FLEXPEN) 100 UNIT/ML FlexPen Inject 8-14 Units into the skin 3 (three) times daily with meals. If glucose is above 70 and Ray Espinoza is eating.  Follow SSI   insulin glargine (LANTUS SOLOSTAR) 100 UNIT/ML Solostar Pen Inject 25 Units into the skin at bedtime.   No facility-administered encounter medications on file as of 01/19/2021.    ALLERGIES: Allergies  Allergen Reactions   Codeine Hives and Itching   VACCINATION STATUS:  There is no immunization history on file for this patient.  Diabetes Ray Espinoza presents for his follow-up diabetic visit. Ray Espinoza has type 1 diabetes mellitus. Onset time: Ray Espinoza was diagnosed at approximate age of 14 years. His disease course has been improving. There are no hypoglycemic associated symptoms. Pertinent negatives for hypoglycemia include no confusion, headaches, pallor or seizures. Pertinent negatives for diabetes include no chest pain, no fatigue, no polydipsia, no polyphagia, no polyuria and no weakness. There are no hypoglycemic complications. Symptoms are stable. There are no diabetic complications. Risk factors for coronary artery disease include diabetes mellitus and male sex. Current diabetic treatment includes intensive insulin program. Ray Espinoza is compliant with treatment most of the time. His weight is fluctuating minimally. Ray Espinoza is following a generally unhealthy diet. When asked about meal planning, Ray Espinoza reported none. Ray Espinoza has not had a previous visit with a dietitian. Ray Espinoza participates in exercise intermittently. His home blood glucose trend is fluctuating minimally. His overall blood glucose range is 130-140 mg/dl. (Ray Espinoza presents today with his meter and logs showing stable, at goal fasting and postprandial glycemic profile.  His POCT A1c today is 7.2%, improving from last visit of 7.6%.  Ray Espinoza did have to get a new meter since last visit as Ray Espinoza lost his  previous one, the new meter only has several readings in it.  Ray Espinoza does report hypoglycemia in the 89s on Thanksgiving but it only happens rarely when Ray Espinoza overestimates his carb consumption at meals.) An ACE inhibitor/angiotensin II receptor blocker is contraindicated. Ray Espinoza does not see a podiatrist.Eye exam is not current.   Review of systems  Constitutional: + Minimally fluctuating body weight,  current Body mass index is 23.4 kg/m. , no fatigue, no subjective  hyperthermia, no subjective hypothermia Eyes: no blurry vision, no xerophthalmia ENT: no sore throat, no nodules palpated in throat, no dysphagia/odynophagia, no hoarseness Cardiovascular: no chest pain, no shortness of breath, no palpitations, no leg swelling Respiratory: no cough, no shortness of breath Gastrointestinal: no nausea/vomiting/diarrhea Musculoskeletal: no muscle/joint aches Skin: no rashes, no hyperemia Neurological: no tremors, no numbness, no tingling, no dizziness Psychiatric: no depression, no anxiety   Objective:    BP 136/78   Pulse 98   Ht 5\' 6"  (1.676 m)   Wt 145 lb (65.8 kg)   BMI 23.40 kg/m   Wt Readings from Last 3 Encounters:  01/19/21 145 lb (65.8 kg)  09/18/20 141 lb 12.8 oz (64.3 kg)  04/17/20 141 lb 12.8 oz (64.3 kg)    BP Readings from Last 3 Encounters:  01/19/21 136/78  09/18/20 120/78  04/17/20 (!) 142/77    Physical Exam- Limited  Constitutional:  Body mass index is 23.4 kg/m. , not in acute distress, normal state of mind Eyes:  EOMI, no exophthalmos Neck: Supple Cardiovascular: RRR, no murmurs, rubs, or gallops, no edema Respiratory: Adequate breathing efforts, no crackles, rales, rhonchi, or wheezing Musculoskeletal: no gross deformities, strength intact in all four extremities, no gross restriction of joint movements Skin:  no rashes, no hyperemia Neurological: no tremor with outstretched hands    Diabetic Labs (most recent): Lab Results  Component Value Date   HGBA1C 7.2 01/19/2021   HGBA1C 7.6 (A) 09/18/2020   HGBA1C 7.3 (H) 04/10/2020   Recent Results (from the past 2160 hour(s))  HgB A1c     Status: Abnormal   Collection Time: 01/19/21  9:41 AM  Result Value Ref Range   Hemoglobin A1C     HbA1c POC (<> result, manual entry) 7.2 4.0 - 5.6 %   HbA1c, POC (prediabetic range)     HbA1c, POC (controlled diabetic range)      CMP Latest Ref Rng & Units 04/10/2020 08/10/2019 09/15/2018  Glucose 65 - 99 mg/dL 09/17/2018) 50(K)  938(H)  BUN 6 - 20 mg/dL 11 12 12   Creatinine 0.76 - 1.27 mg/dL 829(H 3.71  Sodium 134 - 144 mmol/L 142 140 140  Potassium 3.5 - 5.2 mmol/L 4.0 4.4 3.9  Chloride 96 - 106 mmol/L 101 105 105  CO2 20 - 29 mmol/L 19(L) 28 29  Calcium 8.7 - 10.2 mg/dL 9.8 6.96 10.4(H)  Total Protein 6.0 - 8.5 g/dL 7.4 7.1 7.0  Total Bilirubin 0.0 - 1.2 mg/dL 0.6 0.7 0.9  Alkaline Phos 44 - 121 IU/L 83 - -  AST 0 - 40 IU/L 17 12 9(L)  ALT 0 - 44 IU/L 23 11 12      Results for EION, TIMBROOK (MRN [id number]) as of 04/20/2019 10:10  Ref. Range 11/14/2009 04:30 12/15/2015 10:03  TSH Latest Ref Range: 0.50 - 4.30 mIU/L 2.350 2.60  T4,Free(Direct) Latest Ref Range: 0.8 - 1.4 ng/dL 04/22/2019 1.2      Assessment & Plan:   1) Uncontrolled type 1 diabetes mellitus   - Patient has  currently uncontrolled symptomatic type 1 DM since  25 years of age.  Ray Espinoza presents today with his meter and logs showing stable, at goal fasting and postprandial glycemic profile.  His POCT A1c today is 7.2%, improving from last visit of 7.6%.  Ray Espinoza did have to get a new meter since last visit as Ray Espinoza lost his previous one, the new meter only has several readings in it.  Ray Espinoza does report hypoglycemia in the 41s on Thanksgiving but it only happens rarely when Ray Espinoza overestimates his carb consumption at meals.  - I reviewed his most recent labs with him.    Ray Espinoza does not report any gross competitions of diabetes, however, patient remains at a high risk for more acute and chronic complications of diabetes which include CAD, CVA, CKD, retinopathy, and neuropathy. These are all discussed in detail with the patient.  - Nutritional counseling repeated at each appointment due to patients tendency to fall back in to old habits.  - The patient admits there is a room for improvement in their diet and drink choices. -  Suggestion is made for the patient to avoid simple carbohydrates from their diet including Cakes, Sweet Desserts / Pastries, Ice Cream,  Soda (diet and regular), Sweet Tea, Candies, Chips, Cookies, Sweet Pastries, Store Bought Juices, Alcohol in Excess of 1-2 drinks a day, Artificial Sweeteners, Coffee Creamer, and "Sugar-free" Products. This will help patient to have stable blood glucose profile and potentially avoid unintended weight gain.   - I encouraged the patient to switch to unprocessed or minimally processed complex starch and increased protein intake (animal or plant source), fruits, and vegetables.   - Patient is advised to stick to a routine mealtimes to eat 3 meals a day and avoid unnecessary snacks (to snack only to correct hypoglycemia).  - I have approached patient with the following individualized plan to manage diabetes and patient agrees:   -Based on his stable glycemic profile and less hypoglycemia, Ray Espinoza is advised to continue his current dose of Lantus at 25 units SQ nightly.  Ray Espinoza is advised to continue his Novolog 8-14 units TID with meals if glucose is above 70 and Ray Espinoza is eating (this will allow him to inject more frequently and avoid posptrandial highs.   -Ray Espinoza is advised to continue monitoring blood glucose at least 4 times per day, before meals and at bedtime and call the clinic if blood glucose is less than 70 or greater than 200 for 3 tests in a row.  Ray Espinoza could benefit greatly from CGM device.  I sent in Rx for Dexcom to his local pharmacy for fulfillment.  Ray Espinoza has tried this device in the past and it helped him greatly.  - Insulin is the exclusive choice of therapy for his type 1 diabetes.  - Patient specific target  A1c;  LDL, HDL, Triglycerides, and  Waist Circumference were discussed in detail.  2) BP/HTN:  His blood pressure is controlled to target without the use of antihypertensive medications.  3) Lipids/HPL:  His most recent lipid panel from 09/03/20 shows controlled LDL of 68.  Ray Espinoza is not any cholesterol lowering agents at this time.   4)  Weight/Diet:  His Body mass index is 23.4 kg/m.  Ray Espinoza is not a  candidate for weight loss. CDE Consult is initiated, exercise, and detailed carbohydrates information provided.  5) Chronic Care/Health Maintenance: -Patient is encouraged to continue to follow up with Ophthalmology, Podiatrist at least yearly or according to recommendations, and advised to stay away from  smoking. I have recommended yearly flu vaccine and pneumonia vaccination at least every 5 years; moderate intensity exercise for up to 150 minutes weekly; and  sleep for at least 7 hours a day.  - I advised patient to maintain close follow up with Assunta Found, MD for primary care needs.      I spent 30 minutes in the care of the patient today including review of labs from CMP, Lipids, Thyroid Function, Hematology (current and previous including abstractions from other facilities); face-to-face time discussing  his blood glucose readings/logs, discussing hypoglycemia and hyperglycemia episodes and symptoms, medications doses, his options of short and long term treatment based on the latest standards of care / guidelines;  discussion about incorporating lifestyle medicine;  and documenting the encounter.    Please refer to Patient Instructions for Blood Glucose Monitoring and Insulin/Medications Dosing Guide"  in media tab for additional information. Please  also refer to " Patient Self Inventory" in the Media  tab for reviewed elements of pertinent patient history.  Ray Espinoza participated in the discussions, expressed understanding, and voiced agreement with the above plans.  All questions were answered to his satisfaction. Ray Espinoza is encouraged to contact clinic should Ray Espinoza have any questions or concerns prior to his return visit.   Follow up plan: - Return in about 4 months (around 05/19/2021) for Diabetes F/U- A1c and UM in office, Previsit labs, Bring meter and logs.  Ronny Bacon, [facility] [address] [place], [place] [address] Phone:  [phone] Fax: [phone]   01/19/2021, 9:53 AM

## 2021-04-03 ENCOUNTER — Other Ambulatory Visit: Payer: Self-pay | Admitting: Nurse Practitioner

## 2021-04-14 ENCOUNTER — Other Ambulatory Visit: Payer: Self-pay | Admitting: Nurse Practitioner

## 2021-05-20 LAB — COMPREHENSIVE METABOLIC PANEL
ALT: 45 IU/L — ABNORMAL HIGH (ref 0–44)
AST: 29 IU/L (ref 0–40)
Albumin/Globulin Ratio: 1.9 (ref 1.2–2.2)
Albumin: 4.7 g/dL (ref 4.1–5.2)
Alkaline Phosphatase: 96 IU/L (ref 44–121)
BUN/Creatinine Ratio: 20 (ref 9–20)
BUN: 18 mg/dL (ref 6–20)
Bilirubin Total: 0.5 mg/dL (ref 0.0–1.2)
CO2: 23 mmol/L (ref 20–29)
Calcium: 9.8 mg/dL (ref 8.7–10.2)
Chloride: 102 mmol/L (ref 96–106)
Creatinine, Ser: 0.88 mg/dL (ref 0.76–1.27)
Globulin, Total: 2.5 g/dL (ref 1.5–4.5)
Glucose: 146 mg/dL — ABNORMAL HIGH (ref 70–99)
Potassium: 4.7 mmol/L (ref 3.5–5.2)
Sodium: 138 mmol/L (ref 134–144)
Total Protein: 7.2 g/dL (ref 6.0–8.5)
eGFR: 123 mL/min/{1.73_m2} (ref >59)

## 2021-05-20 LAB — VITAMIN D 25 HYDROXY (VIT D DEFICIENCY, FRACTURES): Vit D, 25-Hydroxy: 17.1 ng/mL — ABNORMAL LOW (ref 30.0–100.0)

## 2021-05-25 ENCOUNTER — Ambulatory Visit (INDEPENDENT_AMBULATORY_CARE_PROVIDER_SITE_OTHER): Payer: Medicaid Other | Admitting: Nurse Practitioner

## 2021-05-25 ENCOUNTER — Telehealth: Payer: Self-pay

## 2021-05-25 ENCOUNTER — Encounter: Payer: Self-pay | Admitting: Nurse Practitioner

## 2021-05-25 VITALS — BP 126/78 | HR 96 | Ht 66.0 in | Wt 159.4 lb

## 2021-05-25 DIAGNOSIS — E559 Vitamin D deficiency, unspecified: Secondary | ICD-10-CM | POA: Diagnosis not present

## 2021-05-25 DIAGNOSIS — E1065 Type 1 diabetes mellitus with hyperglycemia: Secondary | ICD-10-CM | POA: Diagnosis not present

## 2021-05-25 LAB — POCT GLYCOSYLATED HEMOGLOBIN (HGB A1C): HbA1c, POC (controlled diabetic range): 8.7 % — AB (ref 0.0–7.0)

## 2021-05-25 MED ORDER — LANTUS SOLOSTAR 100 UNIT/ML ~~LOC~~ SOPN: 24.0000 [IU] | PEN_INJECTOR | Freq: Every day | SUBCUTANEOUS | 2 refills | Status: DC

## 2021-05-25 MED ORDER — DEXCOM G6 TRANSMITTER MISC: 3 refills | Status: DC

## 2021-05-25 MED ORDER — DEXCOM G6 SENSOR MISC: 3 refills | Status: DC

## 2021-05-25 NOTE — Progress Notes (Addendum)
?05/25/2021 ?    ?  ?Endocrinology follow-up note ? ? ?Subjective:  ? ? Patient ID: BROC CASPERS, male    DOB: 12-09-1995.  He is here to follow-up for management of type 1 diabetes requested by  Sharilyn Sites, MD ? ?Past Medical History:  ?Diagnosis Date  ? Diabetes mellitus without complication ([facility])   ? ?History reviewed. No pertinent surgical history. ?Social History  ? ?Socioeconomic History  ? Marital status: Single  ?  Spouse name: Not on file  ? Number of children: Not on file  ? Years of education: Not on file  ? Highest education level: Not on file  ?Occupational History  ? Not on file  ?Tobacco Use  ? Smoking status: Former  ?  Types: Cigarettes  ? Smokeless tobacco: Never  ?Vaping Use  ? Vaping Use: Never used  ?Substance and Sexual Activity  ? Alcohol use: No  ? Drug use: No  ? Sexual activity: Not on file  ?Other Topics Concern  ? Not on file  ?Social History Narrative  ? Not on file  ? ?Social Determinants of Health  ? ?Financial Resource Strain: Not on file  ?Food Insecurity: Not on file  ?Transportation Needs: Not on file  ?Physical Activity: Not on file  ?Stress: Not on file  ?Social Connections: Not on file  ? ?Outpatient Encounter Medications as of 05/25/2021  ?Medication Sig  ? GLOBAL EASE INJECT PEN NEEDLES 31G X 5 MM MISC USE AS DIRECTED FOUR TIMES DAILY  ? glucose blood (ACCU-CHEK GUIDE) test strip Use as instructed  ? NOVOLOG FLEXPEN 100 UNIT/ML FlexPen INJECT 8-14 UNITS into THE SKIN THREE TIMES DAILY WITH meals, if glucose is above 70 AND he is eating FOLLOWING ssi  ? [DISCONTINUED] insulin glargine (LANTUS SOLOSTAR) 100 UNIT/ML Solostar Pen Inject 25 Units into the skin at bedtime.  ? Continuous Blood Gluc Sensor (DEXCOM G6 SENSOR) MISC Change sensor every 10 days as directed  ? Continuous Blood Gluc Sensor (DEXCOM G6 SENSOR) MISC Change sensor every 10 days as directed  ? Continuous Blood Gluc Transmit (DEXCOM G6 TRANSMITTER) MISC Change transmitter every 90 days as directed.  ?  insulin glargine (LANTUS SOLOSTAR) 100 UNIT/ML Solostar Pen Inject 24 Units into the skin at bedtime.  ? [DISCONTINUED] Continuous Blood Gluc Sensor (DEXCOM G6 SENSOR) MISC Change sensor every 10 days as directed (Patient not taking: Reported on 05/25/2021)  ? [DISCONTINUED] Continuous Blood Gluc Sensor (DEXCOM G6 SENSOR) MISC Change sensor every 10 days as directed  ? [DISCONTINUED] Continuous Blood Gluc Transmit (DEXCOM G6 TRANSMITTER) MISC Change transmitter every 90 days as directed. (Patient not taking: Reported on 05/25/2021)  ? [DISCONTINUED] Continuous Blood Gluc Transmit (DEXCOM G6 TRANSMITTER) MISC Change transmitter every 90 days as directed.  ? [DISCONTINUED] Continuous Blood Gluc Transmit (DEXCOM G6 TRANSMITTER) MISC Change transmitter every 90 days as directed.  ? ?No facility-administered encounter medications on file as of 05/25/2021.  ? ?ALLERGIES: ?Allergies  ?Allergen Reactions  ? Codeine Hives and Itching  ? Sulfamethoxazole   ?  Other reaction(s): GI Upset (intolerance)  ? ?VACCINATION STATUS: ? ?There is no immunization history on file for this patient. ? ?Diabetes ?He presents for his follow-up diabetic visit. He has type 1 diabetes mellitus. Onset time: He was diagnosed at approximate age of 18 years. His disease course has been worsening. There are no hypoglycemic associated symptoms. Pertinent negatives for hypoglycemia include no confusion, headaches, pallor or seizures. Associated symptoms include fatigue. Pertinent negatives for diabetes include no chest  pain, no polydipsia, no polyphagia, no polyuria and no weakness. There are no hypoglycemic complications. Symptoms are stable. There are no diabetic complications. Risk factors for coronary artery disease include diabetes mellitus and male sex. Current diabetic treatment includes intensive insulin program. He is compliant with treatment most of the time. His weight is increasing steadily. He is following a generally unhealthy diet. When asked  about meal planning, he reported none. He has not had a previous visit with a dietitian. He participates in exercise intermittently. His home blood glucose trend is fluctuating minimally. (He presents today with his meter, no logs, showing only last 10 readings ranging from 87-268.  He never heard anything about the CGM ordered at last visit.  His POCT A1c today is 8.7%, increasing from last visit of 7.2%.  He did increase his Lantus to 26 units as he noticed his morning readings were a bit higher than usual.  He denies any significant hypoglycemia.) An ACE inhibitor/angiotensin II receptor blocker is contraindicated. He does not see a podiatrist.Eye exam is not current.  ? ?Review of systems ? ?Constitutional: +steadily increasing body weight,  current Body mass index is 25.73 kg/m?. , + fatigue, no subjective hyperthermia, no subjective hypothermia ?Eyes: no blurry vision, no xerophthalmia ?ENT: no sore throat, no nodules palpated in throat, no dysphagia/odynophagia, no hoarseness ?Cardiovascular: no chest pain, no shortness of breath, no palpitations, no leg swelling ?Respiratory: no cough, no shortness of breath ?Gastrointestinal: no nausea/vomiting/diarrhea ?Musculoskeletal: no muscle/joint aches ?Skin: no rashes, no hyperemia ?Neurological: no tremors, no numbness, no tingling, no dizziness ?Psychiatric: no depression, no anxiety ? ? ?Objective:  ?  ?BP 126/78   Pulse 96   Ht 5' 6"  (1.676 m)   Wt 159 lb 6.4 oz (72.3 kg)   SpO2 99%   BMI 25.73 kg/m?   ?Wt Readings from Last 3 Encounters:  ?05/25/21 159 lb 6.4 oz (72.3 kg)  ?01/19/21 145 lb (65.8 kg)  ?09/18/20 141 lb 12.8 oz (64.3 kg)  ?  ?BP Readings from Last 3 Encounters:  ?05/25/21 126/78  ?01/19/21 136/78  ?09/18/20 120/78  ? ? ? ?Physical Exam- Limited ? ?Constitutional:  Body mass index is 25.73 kg/m?. , not in acute distress, normal state of mind ?Eyes:  EOMI, no exophthalmos ?Neck: Supple ?Cardiovascular: RRR, no murmurs, rubs, or gallops, no  edema ?Respiratory: Adequate breathing efforts, no crackles, rales, rhonchi, or wheezing ?Musculoskeletal: no gross deformities, strength intact in all four extremities, no gross restriction of joint movements ?Skin:  no rashes, no hyperemia ?Neurological: no tremor with outstretched hands ? ? ? ?Diabetic Foot Exam - Simple   ?Simple Foot Form ?Diabetic Foot exam was performed with the following findings: Yes 05/25/2021 12:18 PM  ?Visual Inspection ?No deformities, no ulcerations, no other skin breakdown bilaterally: Yes ?Sensation Testing ?Intact to touch and monofilament testing bilaterally: Yes ?Pulse Check ?Posterior Tibialis and Dorsalis pulse intact bilaterally: Yes ?Comments ?  ? ? ? ?Diabetic Labs (most recent): ?Lab Results  ?Component Value Date  ? HGBA1C 8.7 (A) 05/25/2021  ? HGBA1C 7.2 01/19/2021  ? HGBA1C 7.6 (A) 09/18/2020  ? ?Recent Results (from the past 2160 hour(s))  ?Comprehensive metabolic panel     Status: Abnormal  ? Collection Time: 05/19/21  1:00 PM  ?Result Value Ref Range  ? Glucose 146 (H) 70 - 99 mg/dL  ? BUN 18 6 - 20 mg/dL  ? Creatinine, Ser 0.88 0.76 - 1.27 mg/dL  ? eGFR 123 >59 mL/min/1.73  ? BUN/Creatinine Ratio 20  9 - 20  ? Sodium 138 134 - 144 mmol/L  ? Potassium 4.7 3.5 - 5.2 mmol/L  ? Chloride 102 96 - 106 mmol/L  ? CO2 23 20 - 29 mmol/L  ? Calcium 9.8 8.7 - 10.2 mg/dL  ? Total Protein 7.2 6.0 - 8.5 g/dL  ? Albumin 4.7 4.1 - 5.2 g/dL  ? Globulin, Total 2.5 1.5 - 4.5 g/dL  ? Albumin/Globulin Ratio 1.9 1.2 - 2.2  ? Bilirubin Total 0.5 0.0 - 1.2 mg/dL  ? Alkaline Phosphatase 96 44 - 121 IU/L  ? AST 29 0 - 40 IU/L  ? ALT 45 (H) 0 - 44 IU/L  ?VITAMIN D 25 Hydroxy (Vit-D Deficiency, Fractures)     Status: Abnormal  ? Collection Time: 05/19/21  1:00 PM  ?Result Value Ref Range  ? Vit D, 25-Hydroxy 17.1 (L) 30.0 - 100.0 ng/mL  ?  Comment: Vitamin D deficiency has been defined by the Institute of ?Medicine and an Endocrine Society practice guideline as a ?level of serum 25-OH vitamin D less  than 20 ng/mL (1,2). ?The Endocrine Society went on to further define vitamin D ?insufficiency as a level between 21 and 29 ng/mL (2). ?1. IOM (Institute of Medicine). 2010. Dietary reference ?   intakes for calc

## 2021-05-25 NOTE — Patient Instructions (Signed)
Diabetes Mellitus Emergency Preparedness Plan ?A diabetes emergency preparedness plan is a checklist to make sure you have everything you need to manage your diabetes in case of an emergency, such as an evacuation, natural disaster, national security emergency, or pandemic lockdown. ?Managing your diabetes is something you have to do all day every day. The American Diabetes Association and the American College of Endocrinology both recommend putting together an emergency diabetes kit. Your kit should include important information and documents as well as all the supplies you will need to manage your diabetes for at least 1 week. Store it in a portable, waterproof bag or container. The best time to start making your emergency kit is now. ?How to make your emergency kit ?Collect information and documents ?Include the following information and documents in your kit: ?The type of diabetes you have. ?A copy of your health insurance cards and photo ID. ?A list of all your other medical conditions, allergies, and surgeries. ?A list of all your medicines and doses with the contact information for your pharmacy. Ask your health care provider for a list of your current medicines. ?Any recent lab results, including your latest hemoglobin A1C (HbA1C). ?The make, model, and serial number of your insulin pump, if you use one. Also include contact information for the manufacturer. ?Contact information for people who should be notified in case of an emergency. Include your health care provider's name, address, and phone number. ?Collect diabetes care items ?Include the following diabetes care items in your kit: ?At least a 1-week supply of: ?Oral medicines. ?Insulin. ?Blood glucose testing supplies. These include testing strips, lancets, and extra batteries for your blood glucose monitor and pump. ?A charger for the continuous glucose monitor (CGM) receiver and pump. ?Any extra supplies needed for your CGM or pump. ?A supply of  glucagon, glucose tablets, juice, soda, or hard candy in case of hypoglycemia. ?Coolers or cold packs. ?A safe container for syringes, needles, and lancets. ? ?Other preparations ?Other things to consider doing as part of your emergency plan: ?Make sure that your mobile phone is charged and that you have an extra charger, cable, or batteries. ?Choose a meeting place for family members. ?Wear a medical alert or ID bracelet. ?If you have a child with diabetes, make sure your child's school has a copy of his or her emergency plan, including the name of the staff member who will assist your child. ?Where to find more information ?American Diabetes Association: www.diabetes.org ?Centers for Disease Control and Prevention: blogs.cdc.gov ?Summary ?A diabetes emergency preparedness plan is a checklist to make sure you have everything you need in case of an emergency. ?Your kit should include important information and documents as well as all the supplies you will need to manage your condition for at least 1 week. ?Store your kit in a portable, waterproof bag or container. ?The best time to start making your emergency kit is now. ?This information is not intended to replace advice given to you by your health care provider. Make sure you discuss any questions you have with your health care provider. ?Document Revised: 08/16/2019 Document Reviewed: 08/16/2019 ?Elsevier Patient Education ? 2022 Elsevier Inc. ? ?

## 2021-05-25 NOTE — Telephone Encounter (Signed)
Received a fax from [facility] that the patient has been approved for the Dexcom G6 Sensors from 05/11/2021 to 11/21/2021.  ?[employer] ?[phone] ?Appeals Fax [phone] ?Ticket # [id number] ?

## 2021-05-25 NOTE — Telephone Encounter (Signed)
Started prior authorization for the Dexcom G6 Sensor. ?Key # [id number] ?

## 2021-06-11 ENCOUNTER — Telehealth: Payer: Self-pay

## 2021-06-11 MED ORDER — OMNIPOD 5 DEXG7G6 INTRO GEN 5 KIT: PACK | 0 refills | Status: DC

## 2021-06-11 MED ORDER — OMNIPOD 5 DEXG7G6 PODS GEN 5 MISC: 3 refills | Status: DC

## 2021-06-11 NOTE — Telephone Encounter (Signed)
Patient called Omnipod asking about a prescription. Can you send this to the local pharmacy since he is Medicaid? ?

## 2021-07-03 ENCOUNTER — Telehealth: Payer: Self-pay | Admitting: Nurse Practitioner

## 2021-07-03 MED ORDER — INSULIN LISPRO 100 UNIT/ML IJ SOLN: INTRAMUSCULAR | 3 refills | Status: DC

## 2021-07-03 NOTE — Telephone Encounter (Signed)
Pt called and said he has had his Omnipod class and is now set up. He is needing vials sent to his pharmacy instead of pens. ?

## 2021-07-27 ENCOUNTER — Other Ambulatory Visit: Payer: Self-pay | Admitting: Nurse Practitioner

## 2021-08-28 ENCOUNTER — Ambulatory Visit (INDEPENDENT_AMBULATORY_CARE_PROVIDER_SITE_OTHER): Payer: Medicaid Other | Admitting: Nurse Practitioner

## 2021-08-28 ENCOUNTER — Encounter: Payer: Self-pay | Admitting: Nurse Practitioner

## 2021-08-28 VITALS — BP 137/76 | HR 88 | Ht 66.0 in | Wt 161.0 lb

## 2021-08-28 DIAGNOSIS — Z91199 Patient's noncompliance with other medical treatment and regimen due to unspecified reason: Secondary | ICD-10-CM | POA: Diagnosis not present

## 2021-08-28 DIAGNOSIS — E559 Vitamin D deficiency, unspecified: Secondary | ICD-10-CM

## 2021-08-28 DIAGNOSIS — E1065 Type 1 diabetes mellitus with hyperglycemia: Secondary | ICD-10-CM

## 2021-08-28 LAB — POCT GLYCOSYLATED HEMOGLOBIN (HGB A1C): HbA1c, POC (controlled diabetic range): 6.5 % (ref 0.0–7.0)

## 2021-08-28 MED ORDER — OMNIPOD 5 DEXG7G6 PODS GEN 5 MISC: 3 refills | Status: DC

## 2021-08-28 NOTE — Progress Notes (Signed)
08/28/2021       Endocrinology follow-up note   Subjective:    Patient ID: Ray Espinoza, male    DOB: [birth year].  He is here to follow-up for management of type 1 diabetes requested by  Sharilyn Sites, MD  Past Medical History:  Diagnosis Date   Diabetes mellitus without complication [facility])    History reviewed. No pertinent surgical history. Social History   Socioeconomic History   Marital status: Single    Spouse name: Not on file   Number of children: Not on file   Years of education: Not on file   Highest education level: Not on file  Occupational History   Not on file  Tobacco Use   Smoking status: Former    Types: Cigarettes   Smokeless tobacco: Never  Vaping Use   Vaping Use: Never used  Substance and Sexual Activity   Alcohol use: No   Drug use: No   Sexual activity: Not on file  Other Topics Concern   Not on file  Social History Narrative   Not on file   Social Determinants of Health   Financial Resource Strain: Not on file  Food Insecurity: Not on file  Transportation Needs: Not on file  Physical Activity: Not on file  Stress: Not on file  Social Connections: Not on file   Outpatient Encounter Medications as of 08/28/2021  Medication Sig   Continuous Blood Gluc Sensor (DEXCOM G6 SENSOR) MISC Change sensor every 10 days as directed   Continuous Blood Gluc Sensor (DEXCOM G6 SENSOR) MISC Change sensor every 10 days as directed   Continuous Blood Gluc Transmit (DEXCOM G6 TRANSMITTER) MISC Change transmitter every 90 days as directed.   GLOBAL EASE INJECT PEN NEEDLES 31G X 5 MM MISC USE AS DIRECTED FOUR TIMES DAILY   Insulin Disposable Pump (OMNIPOD 5 G6 INTRO, GEN 5,) KIT CHANGE pod every 48-72 hours   Insulin Disposable Pump (OMNIPOD 5 G6 POD, GEN 5,) MISC Change pod every 48-72 hrs   insulin lispro (HUMALOG) 100 UNIT/ML injection Use with insulin pump for TDD around 66 units per day   [DISCONTINUED] glucose blood (ACCU-CHEK GUIDE) test strip Use as  instructed   [DISCONTINUED] Insulin Disposable Pump (OMNIPOD 5 G6 POD, GEN 5,) MISC Change pod every 48-72 hrs   No facility-administered encounter medications on file as of 08/28/2021.   ALLERGIES: Allergies  Allergen Reactions   Codeine Hives and Itching   Sulfamethoxazole     Other reaction(s): GI Upset (intolerance)   VACCINATION STATUS:  There is no immunization history on file for this patient.  Diabetes He presents for his follow-up diabetic visit. He has type 1 diabetes mellitus. Onset time: He was diagnosed at approximate age of 33 years. His disease course has been improving. There are no hypoglycemic associated symptoms. Pertinent negatives for hypoglycemia include no confusion, headaches, pallor or seizures. Associated symptoms include fatigue. Pertinent negatives for diabetes include no chest pain, no polydipsia, no polyphagia, no polyuria and no weakness. There are no hypoglycemic complications. Symptoms are improving. There are no diabetic complications. Risk factors for coronary artery disease include diabetes mellitus and male sex. Current diabetic treatment includes insulin pump. He is compliant with treatment most of the time. His weight is fluctuating minimally. He is following a generally unhealthy diet. When asked about meal planning, he reported none. He has not had a previous visit with a dietitian. He participates in exercise intermittently. His home blood glucose trend is decreasing steadily. His overall blood  glucose range is 140-180 mg/dl. (He presents today with his CGM and Omnipod showing at target glycemic profile overall.  His POCT A1c today is 6.5%, improving from last visit of 8.7%.  Analysis of his CGM shows TIR 61%, TAR 39%, TBR 0% with a GMI of 7.5%.  He denies any significant hypoglycemia.  He is loving his new pump.) An ACE inhibitor/angiotensin II receptor blocker is contraindicated. He does not see a podiatrist.Eye exam is not current.    Review of  systems  Constitutional: + Minimally fluctuating body weight,  current Body mass index is 25.99 kg/m. , no fatigue, no subjective hyperthermia, no subjective hypothermia Eyes: no blurry vision, no xerophthalmia ENT: no sore throat, no nodules palpated in throat, no dysphagia/odynophagia, no hoarseness Cardiovascular: no chest pain, no shortness of breath, no palpitations, no leg swelling Respiratory: no cough, no shortness of breath Gastrointestinal: no nausea/vomiting/diarrhea Musculoskeletal: no muscle/joint aches Skin: no rashes, no hyperemia Neurological: no tremors, no numbness, no tingling, no dizziness Psychiatric: no depression, no anxiety   Objective:    BP 137/76   Pulse 88   Ht 5' 6" (1.676 m)   Wt 161 lb (73 kg)   BMI 25.99 kg/m   Wt Readings from Last 3 Encounters:  08/28/21 161 lb (73 kg)  05/25/21 159 lb 6.4 oz (72.3 kg)  01/19/21 145 lb (65.8 kg)    BP Readings from Last 3 Encounters:  08/28/21 137/76  05/25/21 126/78  01/19/21 136/78     Physical Exam- Limited  Constitutional:  Body mass index is 25.99 kg/m. , not in acute distress, normal state of mind Eyes:  EOMI, no exophthalmos Neck: Supple Cardiovascular: RRR, no murmurs, rubs, or gallops, no edema Respiratory: Adequate breathing efforts, no crackles, rales, rhonchi, or wheezing Musculoskeletal: no gross deformities, strength intact in all four extremities, no gross restriction of joint movements Skin:  no rashes, no hyperemia Neurological: no tremor with outstretched hands    Diabetic Foot Exam - Simple   No data filed      Diabetic Labs (most recent): Lab Results  Component Value Date   HGBA1C 6.5 08/28/2021   HGBA1C 8.7 (A) 05/25/2021   HGBA1C 7.2 01/19/2021   MICROALBUR 65.9 08/10/2019   MICROALBUR 35.4 09/15/2018   Recent Results (from the past 2160 hour(s))  HgB A1c     Status: Normal   Collection Time: 08/28/21  8:35 AM  Result Value Ref Range   Hemoglobin A1C     HbA1c  POC (<> result, manual entry)     HbA1c, POC (prediabetic range)     HbA1c, POC (controlled diabetic range) 6.5 0.0 - 7.0 %        Latest Ref Rng & Units 05/19/2021    1:00 PM 04/10/2020    9:16 AM 08/10/2019   10:53 AM  CMP  Glucose 70 - 99 mg/dL 146  53  125   BUN 6 - 20 mg/dL _0 Creatinine 0.76 - 1.27 mg/dL 0.88  0.80  0.72   Sodium 134 - 144 mmol/L 138  142  140   Potassium 3.5 - 5.2 mmol/L 4.7  4.0  4.4   Chloride 96 - 106 mmol/L 102  101  105   CO2 20 - 29 mmol/L _1 Calcium 8.7 - 10.2 mg/dL 9.8  9.8  10.2   Total Protein 6.0 - 8.5 g/dL 7.2  7.4  7.1   Total Bilirubin 0.0 - 1.2  mg/dL 0.5  0.6  0.7   Alkaline Phos 44 - 121 IU/L 96  83    AST 0 - 40 IU/L _0 ALT 0 - 44 IU/L 45  23  11      Results for JOHNCHARLES, FUSSELMAN (MRN [id number]) as of 04/20/2019 10:10  Ref. Range 11/14/2009 04:30 12/15/2015 10:03  TSH Latest Ref Range: 0.50 - 4.30 mIU/L 2.350 2.60  T4,Free(Direct) Latest Ref Range: 0.8 - 1.4 ng/dL 0.92 1.2      Assessment & Plan:   1) Uncontrolled type 1 diabetes mellitus   - Patient has currently uncontrolled symptomatic type 1 DM since  26 years of age.  He presents today with his CGM and Omnipod showing at target glycemic profile overall.  His POCT A1c today is 6.5%, improving from last visit of 8.7%.  Analysis of his CGM shows TIR 61%, TAR 39%, TBR 0% with a GMI of 7.5%.  He denies any significant hypoglycemia.  He is loving his new pump.  - I reviewed his most recent labs with him.    He does not report any gross competitions of diabetes, however, patient remains at a high risk for more acute and chronic complications of diabetes which include CAD, CVA, CKD, retinopathy, and neuropathy. These are all discussed in detail with the patient.  - Nutritional counseling repeated at each appointment due to patients tendency to fall back in to old habits.  - The patient admits there is a room for improvement in their diet and drink  choices. -  Suggestion is made for the patient to avoid simple carbohydrates from their diet including Cakes, Sweet Desserts / Pastries, Ice Cream, Soda (diet and regular), Sweet Tea, Candies, Chips, Cookies, Sweet Pastries, Store Bought Juices, Alcohol in Excess of 1-2 drinks a day, Artificial Sweeteners, Coffee Creamer, and "Sugar-free" Products. This will help patient to have stable blood glucose profile and potentially avoid unintended weight gain.   - I encouraged the patient to switch to unprocessed or minimally processed complex starch and increased protein intake (animal or plant source), fruits, and vegetables.   - Patient is advised to stick to a routine mealtimes to eat 3 meals a day and avoid unnecessary snacks (to snack only to correct hypoglycemia).  - I have approached patient with the following individualized plan to manage diabetes and patient agrees:   -He has done well with transitioning to Omnipod 5.  No changes will be made to his pump settings today.  -He is advised to continue monitoring blood glucose at least 4 times per day (using his CGM), before meals and at bedtime and call the clinic if blood glucose is less than 70 or greater than 200 for 3 tests in a row.    - Insulin is the exclusive choice of therapy for his type 1 diabetes.  - Patient specific target  A1c;  LDL, HDL, Triglycerides, and  Waist Circumference were discussed in detail.  2) BP/HTN:  His blood pressure is controlled to target without the use of antihypertensive medications.  3) Lipids/HPL:  His most recent lipid panel from 09/03/20 shows controlled LDL of 68.  He is not any cholesterol lowering agents at this time.    4)  Weight/Diet:  His Body mass index is 25.99 kg/m.  He is not a candidate for weight loss. CDE Consult is initiated, exercise, and detailed carbohydrates information provided.  5) Chronic Care/Health Maintenance: -Patient is encouraged to continue to follow up  with Ophthalmology,  Podiatrist at least yearly or according to recommendations, and advised to stay away from smoking. I have recommended yearly flu vaccine and pneumonia vaccination at least every 5 years; moderate intensity exercise for up to 150 minutes weekly; and  sleep for at least 7 hours a day.  6) Vitamin D Deficiency His recent vitamin D level on 05/19/21 was 17.  He is currently taking OTC Vitamin D3 supplement.    - I advised patient to maintain close follow up with Sharilyn Sites, MD for primary care needs.       I spent 30 minutes in the care of the patient today including review of labs from [place], Lipids, Thyroid Function, Hematology (current and previous including abstractions from other facilities); face-to-face time discussing  his blood glucose readings/logs, discussing hypoglycemia and hyperglycemia episodes and symptoms, medications doses, his options of short and long term treatment based on the latest standards of care / guidelines;  discussion about incorporating lifestyle medicine;  and documenting the encounter. Risk reduction counseling performed per USPSTF guidelines to reduce obesity and cardiovascular risk factors.     Please refer to Patient Instructions for Blood Glucose Monitoring and Insulin/Medications Dosing Guide"  in media tab for additional information. Please  also refer to " Patient Self Inventory" in the Media  tab for reviewed elements of pertinent patient history.  Margretta Sidle Boden participated in the discussions, expressed understanding, and voiced agreement with the above plans.  All questions were answered to his satisfaction. he is encouraged to contact clinic should he have any questions or concerns prior to his return visit.   Follow up plan: - Return in about 4 months (around 12/29/2021) for Diabetes F/U with A1c in office, Bring meter and logs, No previsit labs.  Rayetta Pigg, [facility] [address] [place], [place]  [address] Phone: [phone] Fax: [phone]   08/28/2021, 8:40 AM

## 2021-10-11 ENCOUNTER — Other Ambulatory Visit: Payer: Self-pay | Admitting: Nurse Practitioner

## 2021-11-11 ENCOUNTER — Telehealth: Payer: Self-pay

## 2021-11-11 ENCOUNTER — Other Ambulatory Visit (HOSPITAL_COMMUNITY): Payer: Self-pay

## 2021-11-11 NOTE — Telephone Encounter (Signed)
Thank you :)

## 2021-11-11 NOTE — Telephone Encounter (Signed)
Thank you for the update!

## 2021-11-11 NOTE — Telephone Encounter (Signed)
Received notification from [facility] regarding a prior authorization for Dexcom G6 Sensor. Authorization has been APPROVED from 11-11-2021 to 11-11-2022.   KeyJulieanne Cotton - PA Case ID: [id number]

## 2021-11-11 NOTE — Telephone Encounter (Signed)
PA request received from [facility] for [place].   PA has been completed and submitted to CoverMyMeds.  KeyJulieanne Cotton - PA Case ID: [id number]  Awaiting determination.

## 2021-12-30 ENCOUNTER — Encounter: Payer: Self-pay | Admitting: Nurse Practitioner

## 2021-12-30 ENCOUNTER — Ambulatory Visit (INDEPENDENT_AMBULATORY_CARE_PROVIDER_SITE_OTHER): Payer: Medicaid Other | Admitting: Nurse Practitioner

## 2021-12-30 VITALS — BP 120/70 | HR 76 | Ht 66.0 in | Wt 162.2 lb

## 2021-12-30 DIAGNOSIS — E1065 Type 1 diabetes mellitus with hyperglycemia: Secondary | ICD-10-CM | POA: Diagnosis not present

## 2021-12-30 DIAGNOSIS — E559 Vitamin D deficiency, unspecified: Secondary | ICD-10-CM

## 2021-12-30 LAB — POCT GLYCOSYLATED HEMOGLOBIN (HGB A1C): Hemoglobin A1C: 6.7 % — AB (ref 4.0–5.6)

## 2021-12-30 NOTE — Progress Notes (Signed)
12/30/2021       Endocrinology follow-up note   Subjective:    Patient ID: Ray Espinoza, male    DOB: 1996/02/17.  He is here to follow-up for management of type 1 diabetes requested by  Sharilyn Sites, MD  Past Medical History:  Diagnosis Date   Diabetes mellitus without complication [facility])    History reviewed. No pertinent surgical history. Social History   Socioeconomic History   Marital status: Single    Spouse name: Not on file   Number of children: Not on file   Years of education: Not on file   Highest education level: Not on file  Occupational History   Not on file  Tobacco Use   Smoking status: Former    Types: Cigarettes   Smokeless tobacco: Never  Vaping Use   Vaping Use: Never used  Substance and Sexual Activity   Alcohol use: No   Drug use: No   Sexual activity: Not on file  Other Topics Concern   Not on file  Social History Narrative   Not on file   Social Determinants of Health   Financial Resource Strain: Not on file  Food Insecurity: Not on file  Transportation Needs: Not on file  Physical Activity: Not on file  Stress: Not on file  Social Connections: Not on file   Outpatient Encounter Medications as of 12/30/2021  Medication Sig   Continuous Blood Gluc Sensor (DEXCOM G6 SENSOR) MISC Change sensor every 10 days as directed   Continuous Blood Gluc Sensor (DEXCOM G6 SENSOR) MISC Change sensor every 10 days as directed   Continuous Blood Gluc Transmit (DEXCOM G6 TRANSMITTER) MISC Change transmitter every 90 days as directed.   GLOBAL EASE INJECT PEN NEEDLES 31G X 5 MM MISC USE AS DIRECTED FOUR TIMES DAILY   Insulin Disposable Pump (OMNIPOD 5 G6 INTRO, GEN 5,) KIT CHANGE pod every 48-72 hours   Insulin Disposable Pump (OMNIPOD 5 G6 POD, GEN 5,) MISC CHANGE pod EVERY 48 TO 72 hours   insulin lispro (HUMALOG) 100 UNIT/ML injection Use with insulin pump for TDD around 66 units per day   No facility-administered encounter medications on file  as of 12/30/2021.   ALLERGIES: Allergies  Allergen Reactions   Codeine Hives and Itching   Sulfamethoxazole     Other reaction(s): GI Upset (intolerance)   VACCINATION STATUS:  There is no immunization history on file for this patient.  Diabetes He presents for his follow-up diabetic visit. He has type 1 diabetes mellitus. Onset time: He was diagnosed at approximate age of 35 years. His disease course has been stable. There are no hypoglycemic associated symptoms. Pertinent negatives for hypoglycemia include no confusion, headaches, pallor or seizures. Associated symptoms include fatigue. Pertinent negatives for diabetes include no chest pain, no polydipsia, no polyphagia, no polyuria and no weakness. There are no hypoglycemic complications. Symptoms are improving. There are no diabetic complications. Risk factors for coronary artery disease include diabetes mellitus and male sex. Current diabetic treatment includes insulin pump. He is compliant with treatment most of the time. His weight is fluctuating minimally. He is following a generally unhealthy diet. When asked about meal planning, he reported none. He has not had a previous visit with a dietitian. He participates in exercise intermittently. His home blood glucose trend is fluctuating minimally. His overall blood glucose range is 140-180 mg/dl. (He presents today with his CGM and pump showing at goal glycemic profile overall.  His POCT A1c today is 6.7%, essentially unchanged  from previous visit.  Analysis of his CGM shows TIR 62%, TAR 36%, TBR <2% with a GMI of 7.3%.  He denies any significant hypoglycemia.  He continues to love his new glucose management system.) An ACE inhibitor/angiotensin II receptor blocker is contraindicated. He does not see a podiatrist.Eye exam is not current.    Review of systems  Constitutional: + Minimally fluctuating body weight,  current Body mass index is 26.18 kg/m. , no fatigue, no subjective hyperthermia,  no subjective hypothermia Eyes: no blurry vision, no xerophthalmia ENT: no sore throat, no nodules palpated in throat, no dysphagia/odynophagia, no hoarseness Cardiovascular: no chest pain, no shortness of breath, no palpitations, no leg swelling Respiratory: no cough, no shortness of breath Gastrointestinal: no nausea/vomiting/diarrhea Musculoskeletal: no muscle/joint aches Skin: no rashes, no hyperemia Neurological: no tremors, no numbness, no tingling, no dizziness Psychiatric: no depression, no anxiety   Objective:    BP 120/70 (BP Location: Right Arm, Patient Position: Sitting, Cuff Size: Normal)   Pulse 76   Ht _0  (1.676 m)   Wt 162 lb 3.2 oz (73.6 kg)   BMI 26.18 kg/m   Wt Readings from Last 3 Encounters:  12/30/21 162 lb 3.2 oz (73.6 kg)  08/28/21 161 lb (73 kg)  05/25/21 159 lb 6.4 oz (72.3 kg)    BP Readings from Last 3 Encounters:  12/30/21 120/70  08/28/21 137/76  05/25/21 126/78     Physical Exam- Limited  Constitutional:  Body mass index is 26.18 kg/m. , not in acute distress, normal state of mind Eyes:  EOMI, no exophthalmos Neck: Supple Cardiovascular: RRR, no murmurs, rubs, or gallops, no edema Respiratory: Adequate breathing efforts, no crackles, rales, rhonchi, or wheezing Musculoskeletal: no gross deformities, strength intact in all four extremities, no gross restriction of joint movements Skin:  no rashes, no hyperemia Neurological: no tremor with outstretched hands    Diabetic Foot Exam - Simple   No data filed      Diabetic Labs (most recent): Lab Results  Component Value Date   HGBA1C 6.7 (A) 12/30/2021   HGBA1C 6.5 08/28/2021   HGBA1C 8.7 (A) 05/25/2021   MICROALBUR 65.9 08/10/2019   MICROALBUR 35.4 09/15/2018   Recent Results (from the past 2160 hour(s))  HgB A1c     Status: Abnormal   Collection Time: 12/30/21  8:59 AM  Result Value Ref Range   Hemoglobin A1C 6.7 (A) 4.0 - 5.6 %   HbA1c POC (<> result, manual entry)      HbA1c, POC (prediabetic range)     HbA1c, POC (controlled diabetic range)          Latest Ref Rng & Units 05/19/2021    1:00 PM 04/10/2020    9:16 AM 08/10/2019   10:53 AM  CMP  Glucose 70 - 99 mg/dL 146  53  125   BUN 6 - 20 mg/dL _1 Creatinine 0.76 - 1.27 mg/dL 0.88  0.80  0.72   Sodium 134 - 144 mmol/L 138  142  140   Potassium 3.5 - 5.2 mmol/L 4.7  4.0  4.4   Chloride 96 - 106 mmol/L 102  101  105   CO2 20 - 29 mmol/L _2 Calcium 8.7 - 10.2 mg/dL 9.8  9.8  10.2   Total Protein 6.0 - 8.5 g/dL 7.2  7.4  7.1   Total Bilirubin 0.0 - 1.2 mg/dL 0.5  0.6  0.7   Alkaline Phos  44 - 121 IU/L 96  83    AST 0 - 40 IU/L _0 ALT 0 - 44 IU/L 45  23  11      Results for TRUNG, WENZL (MRN [id number]) as of 04/20/2019 10:10  Ref. Range 11/14/2009 04:30 12/15/2015 10:03  TSH Latest Ref Range: 0.50 - 4.30 mIU/L 2.350 2.60  T4,Free(Direct) Latest Ref Range: 0.8 - 1.4 ng/dL 0.92 1.2      Assessment & Plan:   1) Controlled type 1 diabetes mellitus   - Patient has currently uncontrolled symptomatic type 1 DM since  26 years of age.  He presents today with his CGM and pump showing at goal glycemic profile overall.  His POCT A1c today is 6.7%, essentially unchanged from previous visit.  Analysis of his CGM shows TIR 62%, TAR 36%, TBR <2% with a GMI of 7.3%.  He denies any significant hypoglycemia.  He continues to love his new glucose management system.  - I reviewed his most recent labs with him.    He does not report any gross competitions of diabetes, however, patient remains at a high risk for more acute and chronic complications of diabetes which include CAD, CVA, CKD, retinopathy, and neuropathy. These are all discussed in detail with the patient.  - Nutritional counseling repeated at each appointment due to patients tendency to fall back in to old habits.  - The patient admits there is a room for improvement in their diet and drink choices. -   Suggestion is made for the patient to avoid simple carbohydrates from their diet including Cakes, Sweet Desserts / Pastries, Ice Cream, Soda (diet and regular), Sweet Tea, Candies, Chips, Cookies, Sweet Pastries, Store Bought Juices, Alcohol in Excess of 1-2 drinks a day, Artificial Sweeteners, Coffee Creamer, and "Sugar-free" Products. This will help patient to have stable blood glucose profile and potentially avoid unintended weight gain.   - I encouraged the patient to switch to unprocessed or minimally processed complex starch and increased protein intake (animal or plant source), fruits, and vegetables.   - Patient is advised to stick to a routine mealtimes to eat 3 meals a day and avoid unnecessary snacks (to snack only to correct hypoglycemia).  - I have approached patient with the following individualized plan to manage diabetes and patient agrees:   -He has done well with transitioning to Omnipod 5.  No changes will be made to his pump settings today.  -He is advised to continue monitoring blood glucose at least 4 times per day (using his CGM), before meals and at bedtime and call the clinic if blood glucose is less than 70 or greater than 200 for 3 tests in a row.    - Insulin is the exclusive choice of therapy for his type 1 diabetes.  - Patient specific target  A1c;  LDL, HDL, Triglycerides, and  Waist Circumference were discussed in detail.  2) BP/HTN:  His blood pressure is controlled to target without the use of antihypertensive medications.  3) Lipids/HPL:  His most recent lipid panel from 09/03/20 shows controlled LDL of 68.  He is not any cholesterol lowering agents at this time.  Will recheck lipid panel prior to next visit.    4)  Weight/Diet:  His Body mass index is 26.18 kg/m.  He is not a candidate for weight loss. CDE Consult is initiated, exercise, and detailed carbohydrates information provided.  5) Chronic Care/Health Maintenance: -Patient is encouraged to  continue to  follow up with Ophthalmology, Podiatrist at least yearly or according to recommendations, and advised to stay away from smoking. I have recommended yearly flu vaccine and pneumonia vaccination at least every 5 years; moderate intensity exercise for up to 150 minutes weekly; and  sleep for at least 7 hours a day.  6) Vitamin D Deficiency His recent vitamin D level on 05/19/21 was 17.  He is currently taking OTC Vitamin D3 supplement.   Will recheck prior to next visit.  - I advised patient to maintain close follow up with Sharilyn Sites, MD for primary care needs.        I spent 40 minutes in the care of the patient today including review of labs from [facility], Lipids, Thyroid Function, Hematology (current and previous including abstractions from other facilities); face-to-face time discussing  his blood glucose readings/logs, discussing hypoglycemia and hyperglycemia episodes and symptoms, medications doses, his options of short and long term treatment based on the latest standards of care / guidelines;  discussion about incorporating lifestyle medicine;  and documenting the encounter. Risk reduction counseling performed per USPSTF guidelines to reduce obesity and cardiovascular risk factors.     Please refer to Patient Instructions for Blood Glucose Monitoring and Insulin/Medications Dosing Guide"  in media tab for additional information. Please  also refer to " Patient Self Inventory" in the Media  tab for reviewed elements of pertinent patient history.  Margretta Sidle Ruggerio participated in the discussions, expressed understanding, and voiced agreement with the above plans.  All questions were answered to his satisfaction. he is encouraged to contact clinic should he have any questions or concerns prior to his return visit.   Follow up plan: - Return in about 6 months (around 06/30/2022) for Diabetes F/U with A1c in office, Previsit labs, Bring meter and logs.  Rayetta Pigg,  [facility] [address] [place], [place] [address] Phone: [phone] Fax: [phone]   12/30/2021, 9:15 AM

## 2022-02-02 ENCOUNTER — Other Ambulatory Visit: Payer: Self-pay | Admitting: Nurse Practitioner

## 2022-04-06 ENCOUNTER — Other Ambulatory Visit: Payer: Self-pay | Admitting: Nurse Practitioner

## 2022-05-04 ENCOUNTER — Other Ambulatory Visit: Payer: Self-pay | Admitting: Nurse Practitioner

## 2022-05-30 ENCOUNTER — Other Ambulatory Visit: Payer: Self-pay | Admitting: Nurse Practitioner

## 2022-06-10 ENCOUNTER — Other Ambulatory Visit: Payer: Self-pay | Admitting: Nurse Practitioner

## 2022-06-29 ENCOUNTER — Telehealth: Payer: Self-pay | Admitting: Nurse Practitioner

## 2022-06-29 DIAGNOSIS — E1065 Type 1 diabetes mellitus with hyperglycemia: Secondary | ICD-10-CM

## 2022-06-29 DIAGNOSIS — E559 Vitamin D deficiency, unspecified: Secondary | ICD-10-CM

## 2022-06-29 DIAGNOSIS — R5383 Other fatigue: Secondary | ICD-10-CM

## 2022-06-29 NOTE — Telephone Encounter (Signed)
Order updated

## 2022-06-29 NOTE — Telephone Encounter (Signed)
Can someone update these labs?   

## 2022-06-30 ENCOUNTER — Ambulatory Visit: Payer: Medicaid Other | Admitting: Nurse Practitioner

## 2022-07-01 LAB — COMPREHENSIVE METABOLIC PANEL
ALT: 42 IU/L (ref 0–44)
AST: 31 IU/L (ref 0–40)
Albumin/Globulin Ratio: 2.1 (ref 1.2–2.2)
Albumin: 4.7 g/dL (ref 4.3–5.2)
Alkaline Phosphatase: 74 IU/L (ref 44–121)
BUN/Creatinine Ratio: 19 (ref 9–20)
BUN: 14 mg/dL (ref 6–20)
Bilirubin Total: 0.8 mg/dL (ref 0.0–1.2)
CO2: 22 mmol/L (ref 20–29)
Calcium: 9.6 mg/dL (ref 8.7–10.2)
Chloride: 102 mmol/L (ref 96–106)
Creatinine, Ser: 0.75 mg/dL — ABNORMAL LOW (ref 0.76–1.27)
Globulin, Total: 2.2 g/dL (ref 1.5–4.5)
Glucose: 178 mg/dL — ABNORMAL HIGH (ref 70–99)
Potassium: 4.5 mmol/L (ref 3.5–5.2)
Sodium: 138 mmol/L (ref 134–144)
Total Protein: 6.9 g/dL (ref 6.0–8.5)
eGFR: 128 mL/min/{1.73_m2} (ref >59)

## 2022-07-01 LAB — LIPID PANEL
Chol/HDL Ratio: 2.5 ratio (ref 0.0–5.0)
Cholesterol, Total: 152 mg/dL (ref 100–199)
HDL: 61 mg/dL (ref >39)
LDL Chol Calc (NIH): 79 mg/dL (ref 0–99)
Triglycerides: 56 mg/dL (ref 0–149)
VLDL Cholesterol Cal: 12 mg/dL (ref 5–40)

## 2022-07-01 LAB — T4, FREE: Free T4: 1.17 ng/dL (ref 0.82–1.77)

## 2022-07-01 LAB — TSH: TSH: 1.8 u[IU]/mL (ref 0.450–4.500)

## 2022-07-01 LAB — VITAMIN D 25 HYDROXY (VIT D DEFICIENCY, FRACTURES): Vit D, 25-Hydroxy: 28.6 ng/mL — ABNORMAL LOW (ref 30.0–100.0)

## 2022-07-08 ENCOUNTER — Ambulatory Visit (INDEPENDENT_AMBULATORY_CARE_PROVIDER_SITE_OTHER): Payer: Medicaid Other | Admitting: Nurse Practitioner

## 2022-07-08 ENCOUNTER — Encounter: Payer: Self-pay | Admitting: Nurse Practitioner

## 2022-07-08 VITALS — BP 117/75 | HR 81 | Ht 66.0 in | Wt 155.0 lb

## 2022-07-08 DIAGNOSIS — E109 Type 1 diabetes mellitus without complications: Secondary | ICD-10-CM | POA: Diagnosis not present

## 2022-07-08 DIAGNOSIS — E559 Vitamin D deficiency, unspecified: Secondary | ICD-10-CM | POA: Diagnosis not present

## 2022-07-08 DIAGNOSIS — R5383 Other fatigue: Secondary | ICD-10-CM

## 2022-07-08 LAB — POCT GLYCOSYLATED HEMOGLOBIN (HGB A1C): Hemoglobin A1C: 6.7 % — AB (ref 4.0–5.6)

## 2022-07-08 NOTE — Progress Notes (Signed)
07/08/2022       Endocrinology follow-up note   Subjective:    Patient ID: Ray Espinoza, male    DOB: [birth year].  He is here to follow-up for management of type 1 diabetes requested by  Assunta Found, MD  Past Medical History:  Diagnosis Date   Diabetes mellitus without complication [facility])    History reviewed. No pertinent surgical history. Social History   Socioeconomic History   Marital status: Single    Spouse name: Not on file   Number of children: Not on file   Years of education: Not on file   Highest education level: Not on file  Occupational History   Not on file  Tobacco Use   Smoking status: Former    Types: Cigarettes   Smokeless tobacco: Never  Vaping Use   Vaping Use: Never used  Substance and Sexual Activity   Alcohol use: No   Drug use: No   Sexual activity: Not on file  Other Topics Concern   Not on file  Social History Narrative   Not on file   Social Determinants of Health   Financial Resource Strain: Not on file  Food Insecurity: Not on file  Transportation Needs: Not on file  Physical Activity: Not on file  Stress: Not on file  Social Connections: Not on file   Outpatient Encounter Medications as of 07/08/2022  Medication Sig   Continuous Blood Gluc Sensor (DEXCOM G6 SENSOR) MISC Change sensor every 10 days as directed   Continuous Blood Gluc Sensor (DEXCOM G6 SENSOR) MISC CHANGE sensor EVERY 10 DAYS AS DIRECTED   Continuous Glucose Transmitter (DEXCOM G6 TRANSMITTER) MISC CHANGE TRANSMITTER EVERY 90 DAYS AS DIRECTED   GLOBAL EASE INJECT PEN NEEDLES 31G X 5 MM MISC USE AS DIRECTED FOUR TIMES DAILY   Insulin Disposable Pump (OMNIPOD 5 G6 INTRO, GEN 5,) KIT CHANGE pod every 48-72 hours   Insulin Disposable Pump (OMNIPOD 5 G6 PODS, GEN 5,) MISC CHANGE pod EVERY 48 TO 72 hours   insulin lispro (HUMALOG) 100 UNIT/ML injection USE WITH insulin pump FOR total daily DOSE around 66 UNITS PER DAY   No facility-administered encounter  medications on file as of 07/08/2022.   ALLERGIES: Allergies  Allergen Reactions   Codeine Hives and Itching   Sulfamethoxazole     Other reaction(s): GI Upset (intolerance)   VACCINATION STATUS:  There is no immunization history on file for this patient.  Diabetes He presents for his follow-up diabetic visit. He has type 1 diabetes mellitus. Onset time: He was diagnosed at approximate age of 14 years. His disease course has been stable. There are no hypoglycemic associated symptoms. Pertinent negatives for hypoglycemia include no confusion, headaches, pallor or seizures. Associated symptoms include fatigue. Pertinent negatives for diabetes include no chest pain, no polydipsia, no polyphagia, no polyuria and no weakness. There are no hypoglycemic complications. Symptoms are improving. There are no diabetic complications. Risk factors for coronary artery disease include diabetes mellitus and male sex. Current diabetic treatment includes insulin pump. He is compliant with treatment most of the time. His weight is fluctuating minimally. He is following a generally unhealthy diet. When asked about meal planning, he reported none. He has not had a previous visit with a dietitian. He participates in exercise intermittently. His home blood glucose trend is fluctuating minimally. His overall blood glucose range is 140-180 mg/dl. (He presents today with his CGM and pump showing at goal glycemic profile overall.  His POCT A1c today is 6.7%, unchanged  from previous visit.  Analysis of his CGM shows TIR 68%, TAR 30%, TBR 2% with a GMI of 7.2%.  He denies any significant hypoglycemia.  ) An ACE inhibitor/angiotensin II receptor blocker is contraindicated. He does not see a podiatrist.Eye exam is not current.    Review of systems  Constitutional: + Minimally fluctuating body weight,  current Body mass index is 25.02 kg/m. , no fatigue, no subjective hyperthermia, no subjective hypothermia Eyes: no blurry  vision, no xerophthalmia ENT: no sore throat, no nodules palpated in throat, no dysphagia/odynophagia, no hoarseness Cardiovascular: no chest pain, no shortness of breath, no palpitations, no leg swelling Respiratory: no cough, no shortness of breath Gastrointestinal: no nausea/vomiting/diarrhea Musculoskeletal: no muscle/joint aches Skin: no rashes, no hyperemia Neurological: no tremors, no numbness, no tingling, no dizziness Psychiatric: no depression, no anxiety   Objective:    BP 117/75 (BP Location: Left Arm, Patient Position: Sitting, Cuff Size: Large)   Pulse 81   Ht 5\' 6"  (1.676 m)   Wt 155 lb (70.3 kg)   BMI 25.02 kg/m   Wt Readings from Last 3 Encounters:  07/08/22 155 lb (70.3 kg)  12/30/21 162 lb 3.2 oz (73.6 kg)  08/28/21 161 lb (73 kg)    BP Readings from Last 3 Encounters:  07/08/22 117/75  12/30/21 120/70  08/28/21 137/76     Physical Exam- Limited  Constitutional:  Body mass index is 25.02 kg/m. , not in acute distress, normal state of mind Eyes:  EOMI, no exophthalmos Musculoskeletal: no gross deformities, strength intact in all four extremities, no gross restriction of joint movements Skin:  no rashes, no hyperemia Neurological: no tremor with outstretched hands    Diabetic Foot Exam - Simple   Simple Foot Form Diabetic Foot exam was performed with the following findings: Yes 07/08/2022  8:25 AM  Visual Inspection No deformities, no ulcerations, no other skin breakdown bilaterally: Yes Sensation Testing Intact to touch and monofilament testing bilaterally: Yes Pulse Check Posterior Tibialis and Dorsalis pulse intact bilaterally: Yes Comments      Diabetic Labs (most recent): Lab Results  Component Value Date   HGBA1C 6.7 (A) 07/08/2022   HGBA1C 6.7 (A) 12/30/2021   HGBA1C 6.5 08/28/2021   MICROALBUR 65.9 08/10/2019   MICROALBUR 35.4 09/15/2018   Recent Results (from the past 2160 hour(s))  VITAMIN D 25 Hydroxy (Vit-D Deficiency,  Fractures)     Status: Abnormal   Collection Time: 06/30/22  8:27 AM  Result Value Ref Range   Vit D, 25-Hydroxy 28.6 (L) 30.0 - 100.0 ng/mL    Comment: Vitamin D deficiency has been defined by the Institute of Medicine and an Endocrine Society practice guideline as a level of serum 25-OH vitamin D less than 20 ng/mL (1,2). The Endocrine Society went on to further define vitamin D insufficiency as a level between 21 and 29 ng/mL (2). 1. IOM (Institute of Medicine). 2010. Dietary reference    intakes for calcium and D. [place]: The    [employer]. 2. Holick MF, Binkley [place], Bischoff-Ferrari HA, et al.    Evaluation, treatment, and prevention of vitamin D    deficiency: an Endocrine Society clinical practice    guideline. JCEM. 2011 Jul; 96(7):1911-30.   TSH     Status: None   Collection Time: 06/30/22  8:27 AM  Result Value Ref Range   TSH 1.800 0.450 - 4.500 uIU/mL  T4, Free     Status: None   Collection Time: 06/30/22  8:27 AM  Result Value Ref Range   Free T4 1.17 0.82 - 1.77 ng/dL  Lipid Panel     Status: None   Collection Time: 06/30/22  8:27 AM  Result Value Ref Range   Cholesterol, Total 152 100 - 199 mg/dL   Triglycerides 56 0 - 149 mg/dL   HDL 61 >02 mg/dL   VLDL Cholesterol Cal 12 5 - 40 mg/dL   LDL Chol Calc (NIH) 79 0 - 99 mg/dL   Chol/HDL Ratio 2.5 0.0 - 5.0 ratio    Comment:                                   T. Chol/HDL Ratio                                             Men  Women                               1/2 Avg.Risk  3.4    3.3                                   Avg.Risk  5.0    4.4                                2X Avg.Risk  9.6    7.1                                3X Avg.Risk 23.4   11.0   Comprehensive metabolic panel     Status: Abnormal   Collection Time: 06/30/22  8:27 AM  Result Value Ref Range   Glucose 178 (H) 70 - 99 mg/dL   BUN 14 6 - 20 mg/dL   Creatinine, Ser 7.25 (L) 0.76 - 1.27 mg/dL   eGFR 366 >44 IH/KVQ/2.59    BUN/Creatinine Ratio 19 9 - 20   Sodium 138 134 - 144 mmol/L   Potassium 4.5 3.5 - 5.2 mmol/L   Chloride 102 96 - 106 mmol/L   CO2 22 20 - 29 mmol/L   Calcium 9.6 8.7 - 10.2 mg/dL   Total Protein 6.9 6.0 - 8.5 g/dL   Albumin 4.7 4.3 - 5.2 g/dL   Globulin, Total 2.2 1.5 - 4.5 g/dL   Albumin/Globulin Ratio 2.1 1.2 - 2.2   Bilirubin Total 0.8 0.0 - 1.2 mg/dL   Alkaline Phosphatase 74 44 - 121 IU/L   AST 31 0 - 40 IU/L   ALT 42 0 - 44 IU/L  HgB A1c     Status: Abnormal   Collection Time: 07/08/22  8:20 AM  Result Value Ref Range   Hemoglobin A1C 6.7 (A) 4.0 - 5.6 %   HbA1c POC (<> result, manual entry)     HbA1c, POC (prediabetic range)     HbA1c, POC (controlled diabetic range)          Latest Ref Rng & Units 06/30/2022    8:27 AM 05/19/2021    1:00 PM 04/10/2020    9:16 AM  CMP  Glucose 70 -  99 mg/dL 161  096  53   BUN 6 - 20 mg/dL 14  18  11    Creatinine 0.76 - 1.27 mg/dL 0.45  4.09  8.11   Sodium 134 - 144 mmol/L 138  138  142   Potassium 3.5 - 5.2 mmol/L 4.5  4.7  4.0   Chloride 96 - 106 mmol/L 102  102  101   CO2 20 - 29 mmol/L 22  23  19    Calcium 8.7 - 10.2 mg/dL 9.6  9.8  9.8   Total Protein 6.0 - 8.5 g/dL 6.9  7.2  7.4   Total Bilirubin 0.0 - 1.2 mg/dL 0.8  0.5  0.6   Alkaline Phos 44 - 121 IU/L 74  96  83   AST 0 - 40 IU/L 31  29  17    ALT 0 - 44 IU/L 42  45  23      Results for STANLEE, DANH (MRN [id number]) as of 04/20/2019 10:10  Ref. Range 11/14/2009 04:30 12/15/2015 10:03  TSH Latest Ref Range: 0.50 - 4.30 mIU/L 2.350 2.60  T4,Free(Direct) Latest Ref Range: 0.8 - 1.4 ng/dL 2.13 1.2      Assessment & Plan:   1) Controlled type 1 diabetes mellitus   - Patient has currently uncontrolled symptomatic type 1 DM since  27 years of age.  He presents today with his CGM and pump showing at goal glycemic profile overall.  His POCT A1c today is 6.7%, unchanged from previous visit.  Analysis of his CGM shows TIR 68%, TAR 30%, TBR 2% with a GMI of 7.2%.  He  denies any significant hypoglycemia.    - I reviewed his most recent labs with him.    He does not report any gross competitions of diabetes, however, patient remains at a high risk for more acute and chronic complications of diabetes which include CAD, CVA, CKD, retinopathy, and neuropathy. These are all discussed in detail with the patient.  - Nutritional counseling repeated at each appointment due to patients tendency to fall back in to old habits.  - The patient admits there is a room for improvement in their diet and drink choices. -  Suggestion is made for the patient to avoid simple carbohydrates from their diet including Cakes, Sweet Desserts / Pastries, Ice Cream, Soda (diet and regular), Sweet Tea, Candies, Chips, Cookies, Sweet Pastries, Store Bought Juices, Alcohol in Excess of 1-2 drinks a day, Artificial Sweeteners, Coffee Creamer, and "Sugar-free" Products. This will help patient to have stable blood glucose profile and potentially avoid unintended weight gain.   - I encouraged the patient to switch to unprocessed or minimally processed complex starch and increased protein intake (animal or plant source), fruits, and vegetables.   - Patient is advised to stick to a routine mealtimes to eat 3 meals a day and avoid unnecessary snacks (to snack only to correct hypoglycemia).  - I have approached patient with the following individualized plan to manage diabetes and patient agrees:   -He is advised to continue using his Omnipod 5.  I did change his target glucose to 110 (from 120) and his active insulin time to 3 hrs (from 4 hrs).  -He is advised to continue monitoring blood glucose at least 4 times per day (using his CGM), before meals and at bedtime and call the clinic if blood glucose is less than 70 or greater than 200 for 3 tests in a row.    - Insulin is the exclusive choice  of therapy for his type 1 diabetes.  - Patient specific target  A1c;  LDL, HDL, Triglycerides, and   Waist Circumference were discussed in detail.  2) BP/HTN:  His blood pressure is controlled to target without the use of antihypertensive medications.  3) Lipids/HPL:  His most recent lipid panel from 06/30/22 shows controlled LDL of 79.  He is not any cholesterol lowering agents at this time.     4)  Weight/Diet:  His Body mass index is 25.02 kg/m.  He is not a candidate for weight loss. CDE Consult is initiated, exercise, and detailed carbohydrates information provided.  5) Chronic Care/Health Maintenance: -Patient is encouraged to continue to follow up with Ophthalmology, Podiatrist at least yearly or according to recommendations, and advised to stay away from smoking. I have recommended yearly flu vaccine and pneumonia vaccination at least every 5 years; moderate intensity exercise for up to 150 minutes weekly; and  sleep for at least 7 hours a day.  6) Vitamin D Deficiency His recent vitamin D level on 06/30/22 was 28.6.  He is currently taking OTC Vitamin D3 supplement, he is advised to continue.       - I advised patient to maintain close follow up with Assunta Found, MD for primary care needs.     I spent  39  minutes in the care of the patient today including review of labs from CMP, Lipids, Thyroid Function, Hematology (current and previous including abstractions from other facilities); face-to-face time discussing  his blood glucose readings/logs, discussing hypoglycemia and hyperglycemia episodes and symptoms, medications doses, his options of short and long term treatment based on the latest standards of care / guidelines;  discussion about incorporating lifestyle medicine;  and documenting the encounter. Risk reduction counseling performed per USPSTF guidelines to reduce obesity and cardiovascular risk factors.     Please refer to Patient Instructions for Blood Glucose Monitoring and Insulin/Medications Dosing Guide"  in media tab for additional information. Please  also refer to  " Patient Self Inventory" in the Media  tab for reviewed elements of pertinent patient history.  Ray Espinoza participated in the discussions, expressed understanding, and voiced agreement with the above plans.  All questions were answered to his satisfaction. he is encouraged to contact clinic should he have any questions or concerns prior to his return visit.   Follow up plan: - Return in about 6 months (around 01/08/2023) for Diabetes F/U with A1c in office, No previsit labs, Bring meter and logs.  Ronny Bacon, [facility] [address] [place], [place] [address] Phone: [phone] Fax: [phone]   07/08/2022, 8:33 AM

## 2022-08-19 ENCOUNTER — Other Ambulatory Visit: Payer: Self-pay

## 2022-08-19 ENCOUNTER — Telehealth: Payer: Self-pay

## 2022-08-19 NOTE — Telephone Encounter (Signed)
Called Mr Blyth in preparation for an appointment on Friday 08/20/22 to enroll in [employer] and potentially apply for Medicaid. No answer, left voicemail requesting a return call.   Francee Nodal RN [employer]

## 2022-08-20 NOTE — Congregational Nurse Program (Signed)
Mr Esse in today to enroll with Care Connect. He is single, uninsured, has been employed and starting a new job soon.  He has previously applied for Medicaid and was over income. A. McCray checking to see if he is still over income.  Client is a Type 1 Diabetic since age 27. He has been seeing [facility] regularly and was last seen by Ronny Bacon on 07/08/22. A1C at that time was 6.7.  Client currently uses Dexcom G6 sensors however is unable to afford and has been checking blood sugars manually. He also has an Omipod insulin pump and insulin is Humulog. He has his insulin that was 65$ at [facility] drug.  Discussed [facility] does not carry sensors or pods. Found two patient assistance programs one for Dexcom and insulin dependant patients for type 1 and other for Omnipod. Unable to print applications so will send links to client via email.  He has been using [employer] Relion meter and strips.   Will plan to continue Diabetes management with [facility], once he has a bill generated will assist with CAFA by A. McCray of Care Connect.  He wishes to establish primary care for overall health maintenance and to use if illness.  He chooses to establish primary care with [facility] in [place] and appointment secured for 09/02/22 at 0830. Client provided [facility] information for [facility] as well as my contact information.  Will plan to continue to follow for further needs.    Francee Nodal RN [facility] [employer]/[facility].

## 2022-09-02 ENCOUNTER — Telehealth: Payer: Self-pay

## 2022-09-02 ENCOUNTER — Other Ambulatory Visit: Payer: Self-pay

## 2022-09-02 ENCOUNTER — Ambulatory Visit: Payer: Self-pay | Admitting: Physician Assistant

## 2022-09-02 NOTE — Telephone Encounter (Signed)
Attempted to follow up with recently enrolled Care Connect client  who had an appointment this am to establish Primary Medical care with [facility] at 0830 am. Someone answered, when asked to speak to [facility], call disconnected. Text message sent asking for a return call at his convenience.   Francee Nodal RN [employer]

## 2022-09-10 ENCOUNTER — Telehealth: Payer: Self-pay

## 2022-09-10 NOTE — Telephone Encounter (Signed)
Attempted follow up call to [facility] [employer] client for check in as he missed the appointment to establish primary medical care with [facility] on 09/02/22.  No answer to call , left VM requesting return call for follow up and wellness check in. Client is type 1DM and has endocrine appointment on 01/11/23.    Francee Nodal RN [employer]

## 2022-09-21 ENCOUNTER — Other Ambulatory Visit: Payer: Self-pay | Admitting: Nurse Practitioner

## 2022-09-21 ENCOUNTER — Telehealth: Payer: Self-pay | Admitting: Nurse Practitioner

## 2022-09-21 MED ORDER — DEXCOM G6 SENSOR MISC: 3 refills | Status: DC

## 2022-09-21 MED ORDER — OMNIPOD 5 DEXG7G6 PODS GEN 5 MISC: 3 refills | Status: DC

## 2022-09-21 MED ORDER — DEXCOM G6 TRANSMITTER MISC: 3 refills | Status: DC

## 2022-09-21 NOTE — Telephone Encounter (Signed)
Patient was called and made aware. 

## 2022-09-21 NOTE — Telephone Encounter (Signed)
Let me send his scripts to [facility] pharmacy who may be able to direct him better (they may be able to see which pharmacy is cheaper, etc).  I will also reach out to [facility] and see what options we have to help cover cost for him.  He can always revert back to basal/bolus insulin injections but I would hate for him to have to do that since we have come so far with the pump.

## 2022-09-21 NOTE — Telephone Encounter (Signed)
Pt has changed insurance [facility]), he went to get his omni Scientist, research (life sciences), and it was going to be over $200. What else can we send in for him? The pharmacy told him to contact us.

## 2022-09-23 NOTE — Telephone Encounter (Signed)
I looked at the [facility] portal and it looks like it is in process, not sure how long it will take.

## 2022-09-23 NOTE — Telephone Encounter (Signed)
Patient was called and made aware. 

## 2022-09-23 NOTE — Telephone Encounter (Signed)
Patient was called and given this information. He has received the co-pay card. He shares that he has sent in stuff to [facility] , and he is asking how long it may take to hear from them. He alsosyas that he may have not answered some 800 numbers. I see a number , [phone], is this the number that he can call?

## 2022-09-28 ENCOUNTER — Other Ambulatory Visit: Payer: Self-pay

## 2022-09-28 DIAGNOSIS — E109 Type 1 diabetes mellitus without complications: Secondary | ICD-10-CM

## 2022-09-28 MED ORDER — OMNIPOD 5 DEXG7G6 PODS GEN 5 MISC
1 refills | Status: DC
Start: 2022-09-28 — End: 2022-12-16

## 2022-12-16 ENCOUNTER — Telehealth: Payer: Self-pay | Admitting: Nurse Practitioner

## 2022-12-16 DIAGNOSIS — E109 Type 1 diabetes mellitus without complications: Secondary | ICD-10-CM

## 2022-12-16 MED ORDER — OMNIPOD 5 DEXG7G6 PODS GEN 5 MISC
1 refills | Status: DC
Start: 2022-12-16 — End: 2022-12-30

## 2022-12-16 MED ORDER — INSULIN LISPRO 100 UNIT/ML IJ SOLN: INTRAMUSCULAR | 3 refills | Status: DC

## 2022-12-16 MED ORDER — DEXCOM G6 TRANSMITTER MISC: 3 refills | Status: DC

## 2022-12-16 MED ORDER — DEXCOM G6 SENSOR MISC: 3 refills | Status: DC

## 2022-12-16 NOTE — Telephone Encounter (Signed)
Patient left a message asking that all his medications be sent back to [facility]. I called him and he shared that he was tired of [facility] and talking to 15 different people before he could get his medications.  He is asking that that the [facility] G6 transmitter and sensors be sent in , along with the Omnipods. He recently had his insulin filled through [facility].

## 2022-12-16 NOTE — Telephone Encounter (Signed)
Patient was called and made aware. 

## 2022-12-16 NOTE — Telephone Encounter (Signed)
Done

## 2022-12-16 NOTE — Telephone Encounter (Signed)
Pt wants his prescriptions sent to [facility], he needs refills of G6 sensors, transmitter and omnipods.

## 2022-12-30 ENCOUNTER — Telehealth: Payer: Self-pay | Admitting: Nurse Practitioner

## 2022-12-30 DIAGNOSIS — E109 Type 1 diabetes mellitus without complications: Secondary | ICD-10-CM

## 2022-12-30 MED ORDER — DEXCOM G6 TRANSMITTER MISC: 2 refills | Status: DC

## 2022-12-30 MED ORDER — OMNIPOD 5 DEXG7G6 PODS GEN 5 MISC
2 refills | Status: DC
Start: 2022-12-30 — End: 2023-11-24

## 2022-12-30 MED ORDER — DEXCOM G6 SENSOR MISC
2 refills | Status: DC
Start: 2022-12-30 — End: 2023-01-11

## 2022-12-30 NOTE — Telephone Encounter (Signed)
Rx sent 

## 2022-12-30 NOTE — Telephone Encounter (Signed)
Pt states prescription for pump supplies and dexcom g6 [facility]

## 2023-01-11 ENCOUNTER — Encounter: Payer: Self-pay | Admitting: Nurse Practitioner

## 2023-01-11 ENCOUNTER — Ambulatory Visit (INDEPENDENT_AMBULATORY_CARE_PROVIDER_SITE_OTHER): Payer: Commercial Managed Care - PPO | Admitting: Nurse Practitioner

## 2023-01-11 VITALS — BP 134/77 | HR 73 | Ht 66.0 in | Wt 156.0 lb

## 2023-01-11 DIAGNOSIS — E109 Type 1 diabetes mellitus without complications: Secondary | ICD-10-CM | POA: Diagnosis not present

## 2023-01-11 DIAGNOSIS — E559 Vitamin D deficiency, unspecified: Secondary | ICD-10-CM

## 2023-01-11 DIAGNOSIS — R5383 Other fatigue: Secondary | ICD-10-CM | POA: Diagnosis not present

## 2023-01-11 LAB — POCT GLYCOSYLATED HEMOGLOBIN (HGB A1C): Hemoglobin A1C: 7.1 % — AB (ref 4.0–5.6)

## 2023-01-11 MED ORDER — DEXCOM G7 SENSOR MISC: 1.0000 | 3 refills | Status: DC

## 2023-01-11 NOTE — Progress Notes (Signed)
01/11/2023       Endocrinology follow-up note   Subjective:    Patient ID: Ray Espinoza, male    DOB: [birth year].  He is here to follow-up for management of type 1 diabetes requested by  Assunta Found, MD  Past Medical History:  Diagnosis Date   Diabetes mellitus without complication [facility])    History reviewed. No pertinent surgical history. Social History   Socioeconomic History   Marital status: Single    Spouse name: Not on file   Number of children: Not on file   Years of education: Not on file   Highest education level: Not on file  Occupational History   Not on file  Tobacco Use   Smoking status: Former    Types: Cigarettes   Smokeless tobacco: Never  Vaping Use   Vaping status: Never Used  Substance and Sexual Activity   Alcohol use: No   Drug use: No   Sexual activity: Not on file  Other Topics Concern   Not on file  Social History Narrative   Not on file   Social Determinants of Health   Financial Resource Strain: Not on file  Food Insecurity: Not on file  Transportation Needs: Not on file  Physical Activity: Not on file  Stress: Not on file  Social Connections: Not on file   Outpatient Encounter Medications as of 01/11/2023  Medication Sig   Continuous Glucose Sensor (DEXCOM G7 SENSOR) MISC Inject 1 Application into the skin as directed. Change sensor every 10 days as directed.   GLOBAL EASE INJECT PEN NEEDLES 31G X 5 MM MISC USE AS DIRECTED FOUR TIMES DAILY   Insulin Disposable Pump (OMNIPOD 5 DEXG7G6 PODS GEN 5) MISC CHANGE pod EVERY 48 TO 72 hours   Insulin Disposable Pump (OMNIPOD 5 G6 INTRO, GEN 5,) KIT CHANGE pod every 48-72 hours   insulin lispro (HUMALOG) 100 UNIT/ML injection USE WITH insulin pump FOR total daily DOSE around 66 UNITS PER DAY   [DISCONTINUED] Continuous Glucose Sensor (DEXCOM G6 SENSOR) MISC Change sensor every 10 days as directed   [DISCONTINUED] Continuous Glucose Sensor (DEXCOM G6 SENSOR) MISC Change sensor every  10 days as directed   [DISCONTINUED] Continuous Glucose Transmitter (DEXCOM G6 TRANSMITTER) MISC Change transmitter every 90 days as directed.   No facility-administered encounter medications on file as of 01/11/2023.   ALLERGIES: Allergies  Allergen Reactions   Codeine Hives and Itching   Sulfamethoxazole     Other reaction(s): GI Upset (intolerance)   VACCINATION STATUS:  There is no immunization history on file for this patient.  Diabetes He presents for his follow-up diabetic visit. He has type 1 diabetes mellitus. Onset time: He was diagnosed at approximate age of 27 years. His disease course has been fluctuating. There are no hypoglycemic associated symptoms. Pertinent negatives for hypoglycemia include no confusion, headaches, pallor or seizures. Associated symptoms include fatigue. Pertinent negatives for diabetes include no chest pain, no polydipsia, no polyphagia, no polyuria and no weakness. There are no hypoglycemic complications. Symptoms are improving. There are no diabetic complications. Risk factors for coronary artery disease include diabetes mellitus and male sex. Current diabetic treatment includes insulin pump. He is compliant with treatment most of the time. His weight is fluctuating minimally. He is following a generally unhealthy diet. When asked about meal planning, he reported none. He has not had a previous visit with a dietitian. He participates in exercise intermittently. His home blood glucose trend is fluctuating dramatically. His overall blood glucose range  is 140-180 mg/dl. (He presents today with his CGM and Omnipod 5 showing dramatically fluctuating glycemic profile.  His POCT A1c today is 7.1%, increasing from last visit of 6.7%.  Analysis of his CGM shows TIR 63%, TAR 33%, TBR 4%.  He notes he had trouble getting his sensors for some time due to insurance issues.  He notes Medicaid dropped him, so he went on his wifes insurance plan.  He has since gotten all  worked out and is back to using his CGM and pump optimally.) An ACE inhibitor/angiotensin II receptor blocker is contraindicated. He does not see a podiatrist.Eye exam is not current.    Review of systems  Constitutional: + Minimally fluctuating body weight,  current Body mass index is 25.18 kg/m. , no fatigue, no subjective hyperthermia, no subjective hypothermia Eyes: no blurry vision, no xerophthalmia ENT: no sore throat, no nodules palpated in throat, no dysphagia/odynophagia, no hoarseness Cardiovascular: no chest pain, no shortness of breath, no palpitations, no leg swelling Respiratory: no cough, no shortness of breath Gastrointestinal: no nausea/vomiting/diarrhea Musculoskeletal: no muscle/joint aches Skin: no rashes, no hyperemia Neurological: no tremors, no numbness, no tingling, no dizziness Psychiatric: no depression, no anxiety   Objective:    BP 134/77 (BP Location: Left Arm, Patient Position: Sitting, Cuff Size: Large)   Pulse 73   Ht 5\' 6"  (1.676 m)   Wt 156 lb (70.8 kg)   BMI 25.18 kg/m   Wt Readings from Last 3 Encounters:  01/11/23 156 lb (70.8 kg)  07/08/22 155 lb (70.3 kg)  12/30/21 162 lb 3.2 oz (73.6 kg)    BP Readings from Last 3 Encounters:  01/11/23 134/77  07/08/22 117/75  12/30/21 120/70     Physical Exam- Limited  Constitutional:  Body mass index is 25.18 kg/m. , not in acute distress, normal state of mind Eyes:  EOMI, no exophthalmos Musculoskeletal: no gross deformities, strength intact in all four extremities, no gross restriction of joint movements Skin:  no rashes, no hyperemia Neurological: no tremor with outstretched hands    Diabetic Foot Exam - Simple   No data filed      Diabetic Labs (most recent): Lab Results  Component Value Date   HGBA1C 7.1 (A) 01/11/2023   HGBA1C 6.7 (A) 07/08/2022   HGBA1C 6.7 (A) 12/30/2021   MICROALBUR 65.9 08/10/2019   MICROALBUR 35.4 09/15/2018   Recent Results (from the past 2160  hour(s))  HgB A1c     Status: Abnormal   Collection Time: 01/11/23  8:42 AM  Result Value Ref Range   Hemoglobin A1C 7.1 (A) 4.0 - 5.6 %   HbA1c POC (<> result, manual entry)     HbA1c, POC (prediabetic range)     HbA1c, POC (controlled diabetic range)           Latest Ref Rng & Units 06/30/2022    8:27 AM 05/19/2021    1:00 PM 04/10/2020    9:16 AM  CMP  Glucose 70 - 99 mg/dL 086  578  53   BUN 6 - 20 mg/dL 14  18  11    Creatinine 0.76 - 1.27 mg/dL 4.69  6.29  5.28   Sodium 134 - 144 mmol/L 138  138  142   Potassium 3.5 - 5.2 mmol/L 4.5  4.7  4.0   Chloride 96 - 106 mmol/L 102  102  101   CO2 20 - 29 mmol/L 22  23  19    Calcium 8.7 - 10.2 mg/dL 9.6  9.8  9.8   Total Protein 6.0 - 8.5 g/dL 6.9  7.2  7.4   Total Bilirubin 0.0 - 1.2 mg/dL 0.8  0.5  0.6   Alkaline Phos 44 - 121 IU/L 74  96  83   AST 0 - 40 IU/L 31  29  17    ALT 0 - 44 IU/L 42  45  23      Results for BERTIN, CACCIATORE (MRN [id number]) as of 04/20/2019 10:10  Ref. Range 11/14/2009 04:30 12/15/2015 10:03  TSH Latest Ref Range: 0.50 - 4.30 mIU/L 2.350 2.60  T4,Free(Direct) Latest Ref Range: 0.8 - 1.4 ng/dL 7.06 1.2      Assessment & Plan:   1) Controlled type 1 diabetes mellitus   - Patient has currently uncontrolled symptomatic type 1 DM since  28 years of age.  He presents today with his CGM and Omnipod 5 showing dramatically fluctuating glycemic profile.  His POCT A1c today is 7.1%, increasing from last visit of 6.7%.  Analysis of his CGM shows TIR 63%, TAR 33%, TBR 4%.  He notes he had trouble getting his sensors for some time due to insurance issues.  He notes Medicaid dropped him, so he went on his wifes insurance plan.  He has since gotten all worked out and is back to using his CGM and pump optimally.  - I reviewed his most recent labs with him.    He does not report any gross competitions of diabetes, however, patient remains at a high risk for more acute and chronic complications of diabetes which  include CAD, CVA, CKD, retinopathy, and neuropathy. These are all discussed in detail with the patient.  - Nutritional counseling repeated at each appointment due to patients tendency to fall back in to old habits.  - The patient admits there is a room for improvement in their diet and drink choices. -  Suggestion is made for the patient to avoid simple carbohydrates from their diet including Cakes, Sweet Desserts / Pastries, Ice Cream, Soda (diet and regular), Sweet Tea, Candies, Chips, Cookies, Sweet Pastries, Store Bought Juices, Alcohol in Excess of 1-2 drinks a day, Artificial Sweeteners, Coffee Creamer, and "Sugar-free" Products. This will help patient to have stable blood glucose profile and potentially avoid unintended weight gain.   - I encouraged the patient to switch to unprocessed or minimally processed complex starch and increased protein intake (animal or plant source), fruits, and vegetables.   - Patient is advised to stick to a routine mealtimes to eat 3 meals a day and avoid unnecessary snacks (to snack only to correct hypoglycemia).  - I have approached patient with the following individualized plan to manage diabetes and patient agrees:   -He is advised to continue using his Omnipod 5.  I did not make any changes to settings today.  -He is advised to continue monitoring blood glucose at least 4 times per day (using his CGM), before meals and at bedtime and call the clinic if blood glucose is less than 70 or greater than 200 for 3 tests in a row.  I did send in upgrade to [facility] G7 for him to [employer].  - Insulin is the exclusive choice of therapy for his type 1 diabetes.  - Patient specific target  A1c;  LDL, HDL, Triglycerides, and  Waist Circumference were discussed in detail.  2) BP/HTN:  His blood pressure is controlled to target without the use of antihypertensive medications.  3) Lipids/HPL:  His most recent lipid panel from  06/30/22 shows controlled LDL of 79.   He is not any cholesterol lowering agents at this time.   Will recheck lipid panel prior to next visit.  4)  Weight/Diet:  His Body mass index is 25.18 kg/m.  He is not a candidate for weight loss. CDE Consult is initiated, exercise, and detailed carbohydrates information provided.  5) Chronic Care/Health Maintenance: -Patient is encouraged to continue to follow up with Ophthalmology, Podiatrist at least yearly or according to recommendations, and advised to stay away from smoking. I have recommended yearly flu vaccine and pneumonia vaccination at least every 5 years; moderate intensity exercise for up to 150 minutes weekly; and  sleep for at least 7 hours a day.  6) Vitamin D Deficiency His recent vitamin D level on 06/30/22 was 28.6.  He is NOT currently taking OTC Vitamin D3 supplement.   Will recheck vitamin d prior to next visit.    - I advised patient to maintain close follow up with Assunta Found, MD for primary care needs.     I spent  36  minutes in the care of the patient today including review of labs from CMP, Lipids, Thyroid Function, Hematology (current and previous including abstractions from other facilities); face-to-face time discussing  his blood glucose readings/logs, discussing hypoglycemia and hyperglycemia episodes and symptoms, medications doses, his options of short and long term treatment based on the latest standards of care / guidelines;  discussion about incorporating lifestyle medicine;  and documenting the encounter. Risk reduction counseling performed per USPSTF guidelines to reduce obesity and cardiovascular risk factors.     Please refer to Patient Instructions for Blood Glucose Monitoring and Insulin/Medications Dosing Guide"  in media tab for additional information. Please  also refer to " Patient Self Inventory" in the Media  tab for reviewed elements of pertinent patient history.  Raoul Pitch Banton participated in the discussions, expressed understanding,  and voiced agreement with the above plans.  All questions were answered to his satisfaction. he is encouraged to contact clinic should he have any questions or concerns prior to his return visit.   Follow up plan: - Return in about 4 months (around 05/11/2023) for Diabetes F/U- A1c and UM in office, Previsit labs, Bring meter and logs.  Ronny Bacon, [facility] [address] [place], [place] [address] Phone: [phone] Fax: [phone]   01/11/2023, 9:07 AM

## 2023-01-26 ENCOUNTER — Telehealth: Payer: Self-pay

## 2023-01-26 NOTE — Telephone Encounter (Addendum)
Called [facility] client for follow up. He never established with [facility] and had applied for Medicaid. Inquired about Medicaid acceptance or denial. He has continued to see Endocrine and Dr. Phillips Odor for PCP. He states he did not proceed with Medicaid, he now is insured through his wife's work and has [employer].  He states he is able to get his medications and Dexcom and has no other needs at this time.  Will update [facility] enrollment status.  Client to contact us if he were to have any other needs.  Francee Nodal RN [employer]

## 2023-03-17 ENCOUNTER — Encounter (HOSPITAL_COMMUNITY): Payer: Self-pay

## 2023-03-17 ENCOUNTER — Emergency Department (HOSPITAL_COMMUNITY)
Admission: EM | Admit: 2023-03-17 | Discharge: 2023-03-18 | Disposition: A | Discharge status: Home / Self Care | Payer: Commercial Managed Care - PPO | Attending: Emergency Medicine | Admitting: Emergency Medicine

## 2023-03-17 ENCOUNTER — Other Ambulatory Visit: Payer: Self-pay

## 2023-03-17 DIAGNOSIS — R0789 Other chest pain: Secondary | ICD-10-CM | POA: Insufficient documentation

## 2023-03-17 DIAGNOSIS — R079 Chest pain, unspecified: Secondary | ICD-10-CM | POA: Diagnosis present

## 2023-03-17 NOTE — ED Triage Notes (Signed)
POV from home cc chest pain and tremors that started a couple hours ago. Says he was just laying in bed when it started.  2/10 sharp intermittent center chest pain.  Says he smoked marijuana today

## 2023-03-18 ENCOUNTER — Emergency Department (HOSPITAL_COMMUNITY): Payer: Commercial Managed Care - PPO

## 2023-03-18 LAB — COMPREHENSIVE METABOLIC PANEL
ALT: 23 U/L (ref 0–44)
AST: 21 U/L (ref 15–41)
Albumin: 4.5 g/dL (ref 3.5–5.0)
Alkaline Phosphatase: 54 U/L (ref 38–126)
Anion gap: 10 (ref 5–15)
BUN: 16 mg/dL (ref 6–20)
CO2: 25 mmol/L (ref 22–32)
Calcium: 9.8 mg/dL (ref 8.9–10.3)
Chloride: 103 mmol/L (ref 98–111)
Creatinine, Ser: 0.72 mg/dL (ref 0.61–1.24)
GFR, Estimated: >60 mL/min (ref >60)
Glucose, Bld: 143 mg/dL — ABNORMAL HIGH (ref 70–99)
Potassium: 3.9 mmol/L (ref 3.5–5.1)
Sodium: 138 mmol/L (ref 135–145)
Total Bilirubin: 0.8 mg/dL (ref 0.0–1.2)
Total Protein: 7.3 g/dL (ref 6.5–8.1)

## 2023-03-18 LAB — CBC WITH DIFFERENTIAL/PLATELET
Abs Immature Granulocytes: 0.04 10*3/uL (ref 0.00–0.07)
Basophils Absolute: 0 10*3/uL (ref 0.0–0.1)
Basophils Relative: 1 %
Eosinophils Absolute: 0.1 10*3/uL (ref 0.0–0.5)
Eosinophils Relative: 1 %
HCT: 39.5 % (ref 39.0–52.0)
Hemoglobin: 13.3 g/dL (ref 13.0–17.0)
Immature Granulocytes: 1 %
Lymphocytes Relative: 16 %
Lymphs Abs: 1 10*3/uL (ref 0.7–4.0)
MCH: 29.1 pg (ref 26.0–34.0)
MCHC: 33.7 g/dL (ref 30.0–36.0)
MCV: 86.4 fL (ref 80.0–100.0)
Monocytes Absolute: 0.5 10*3/uL (ref 0.1–1.0)
Monocytes Relative: 8 %
Neutro Abs: 4.8 10*3/uL (ref 1.7–7.7)
Neutrophils Relative %: 73 %
Platelets: 204 10*3/uL (ref 150–400)
RBC: 4.57 MIL/uL (ref 4.22–5.81)
RDW: 12.8 % (ref 11.5–15.5)
WBC: 6.4 10*3/uL (ref 4.0–10.5)
nRBC: 0 % (ref 0.0–0.2)

## 2023-03-18 LAB — TROPONIN I (HIGH SENSITIVITY)
Troponin I (High Sensitivity): <2 ng/L (ref <18)
Troponin I (High Sensitivity): <2 ng/L (ref <18)

## 2023-03-18 LAB — D-DIMER, QUANTITATIVE: D-Dimer, Quant: <0.27 ug{FEU}/mL (ref 0.00–0.50)

## 2023-03-18 LAB — LIPASE, BLOOD: Lipase: 25 U/L (ref 11–51)

## 2023-03-18 MED ORDER — FAMOTIDINE 20 MG PO TABS: 20.0000 mg | ORAL_TABLET | Freq: Two times a day (BID) | ORAL | 0 refills | Status: AC

## 2023-03-18 MED ORDER — ALUM & MAG HYDROXIDE-SIMETH 200-200-20 MG/5ML PO SUSP
30.0000 mL | Freq: Once | ORAL | Status: AC
  Administered 2023-03-18: 30 mL via ORAL
  Filled 2023-03-18: qty 30

## 2023-03-18 NOTE — ED Provider Notes (Signed)
[place] EMERGENCY DEPARTMENT AT [facility] Provider Note   CSN: [id number] Arrival date & time: 03/17/23  2315     History  Chief Complaint  Patient presents with   Chest Pain    Ray Espinoza is a 28 y.o. male.  Patient presents to the emergency department for evaluation of chest pain.  Patient reports intermittent pain in the center of his chest that is mild, 2 out of 10.  He reports that he will get a stabbing pain and then it will quickly resolve and then recur it sometime later.  No continuous pain.  He has not had any shortness of breath.  He does report that he feels very anxious.       Home Medications Prior to Admission medications   Medication Sig Start Date End Date Taking? Authorizing Provider  famotidine (PEPCID) 20 MG tablet Take 1 tablet (20 mg total) by mouth 2 (two) times daily. 03/18/23  Yes Toa Mia, Canary Brim, MD  Continuous Glucose Sensor (DEXCOM G7 SENSOR) MISC Inject 1 Application into the skin as directed. Change sensor every 10 days as directed. 01/11/23   Dani Gobble, NP  GLOBAL EASE INJECT PEN NEEDLES 31G X 5 MM MISC USE AS DIRECTED FOUR TIMES DAILY 04/03/21   Dani Gobble, NP  Insulin Disposable Pump (OMNIPOD 5 DEXG7G6 PODS GEN 5) MISC CHANGE pod EVERY 48 TO 72 hours 12/30/22   Dani Gobble, NP  Insulin Disposable Pump (OMNIPOD 5 G6 INTRO, GEN 5,) KIT CHANGE pod every 48-72 hours 07/28/21   Dani Gobble, NP  insulin lispro (HUMALOG) 100 UNIT/ML injection USE WITH insulin pump FOR total daily DOSE around 66 UNITS PER DAY 12/16/22   Dani Gobble, NP      Allergies    Codeine and Sulfamethoxazole    Review of Systems   Review of Systems  Physical Exam Updated Vital Signs BP (!) 140/85   Pulse 82   Temp 98.9 F (37.2 C) (Oral)   Resp 13   Ht 5\' 7"  (1.702 m)   Wt 68 kg   SpO2 95%   BMI 23.49 kg/m  Physical Exam Vitals and nursing note reviewed.  Constitutional:      General: He is not in  acute distress.    Appearance: He is well-developed.  HENT:     Head: Normocephalic and atraumatic.     Mouth/Throat:     Mouth: Mucous membranes are moist.  Eyes:     General: Vision grossly intact. Gaze aligned appropriately.     Extraocular Movements: Extraocular movements intact.     Conjunctiva/sclera: Conjunctivae normal.  Cardiovascular:     Rate and Rhythm: Normal rate and regular rhythm.     Pulses: Normal pulses.     Heart sounds: Normal heart sounds, S1 normal and S2 normal. No murmur heard.    No friction rub. No gallop.  Pulmonary:     Effort: Pulmonary effort is normal. No respiratory distress.     Breath sounds: Normal breath sounds.  Abdominal:     Palpations: Abdomen is soft.     Tenderness: There is no abdominal tenderness. There is no guarding or rebound.     Hernia: No hernia is present.  Musculoskeletal:        General: No swelling.     Cervical back: Full passive range of motion without pain, normal range of motion and neck supple. No pain with movement, spinous process tenderness or muscular tenderness. Normal range of  motion.     Right lower leg: No edema.     Left lower leg: No edema.  Skin:    General: Skin is warm and dry.     Capillary Refill: Capillary refill takes less than 2 seconds.     Findings: No ecchymosis, erythema, lesion or wound.  Neurological:     Mental Status: He is alert and oriented to person, place, and time.     GCS: GCS eye subscore is 4. GCS verbal subscore is 5. GCS motor subscore is 6.     Cranial Nerves: Cranial nerves 2-12 are intact.     Sensory: Sensation is intact.     Motor: Motor function is intact. No weakness or abnormal muscle tone.     Coordination: Coordination is intact.  Psychiatric:        Mood and Affect: Mood normal.        Speech: Speech normal.        Behavior: Behavior normal.     ED Results / Procedures / Treatments   Labs (all labs ordered are listed, but only abnormal results are displayed) Labs  Reviewed  COMPREHENSIVE METABOLIC PANEL - Abnormal; Notable for the following components:      Result Value   Glucose, Bld 143 (*)    All other components within normal limits  CBC WITH DIFFERENTIAL/PLATELET  LIPASE, BLOOD  D-DIMER, QUANTITATIVE  TROPONIN I (HIGH SENSITIVITY)  TROPONIN I (HIGH SENSITIVITY)    EKG EKG Interpretation Date/Time:  Thursday March 17 2023 23:25:55 EST Ventricular Rate:  113 PR Interval:  128 QRS Duration:  92 QT Interval:  310 QTC Calculation: 425 R Axis:   95  Text Interpretation: Sinus tachycardia Rightward axis Borderline ECG No previous ECGs available Confirmed by Gilda Crease [phone]) on 03/17/2023 11:27:24 PM  Radiology DG Chest 2 View Result Date: 03/18/2023 CLINICAL DATA:  Chest pain EXAM: CHEST - 2 VIEW COMPARISON:  11/13/2009 FINDINGS: The heart size and mediastinal contours are within normal limits. Both lungs are clear. The visualized skeletal structures are unremarkable. IMPRESSION: No active cardiopulmonary disease. Electronically Signed   By: Charlett Nose M.D.   On: 03/18/2023 00:48    Procedures Procedures    Medications Ordered in ED Medications  alum & mag hydroxide-simeth (MAALOX/MYLANTA) 200-200-20 MG/5ML suspension 30 mL (has no administration in time range)    ED Course/ Medical Decision Making/ A&P                                 Medical Decision Making Amount and/or Complexity of Data Reviewed Labs: ordered. Decision-making details documented in ED Course. Radiology: ordered and independent interpretation performed. Decision-making details documented in ED Course. ECG/medicine tests: ordered and independent interpretation performed. Decision-making details documented in ED Course.  Risk OTC drugs.   Differential Diagnosis considered includes, but not limited to: STEMI; NSTEMI; myocarditis; pericarditis; pulmonary embolism; aortic dissection; pneumothorax; pneumonia; gastritis; musculoskeletal  pain  Presents to the emergency department for evaluation of chest pain.  Patient with a persistent dull pain in the center of his chest.  No associated shortness of breath.  Patient did smoke marijuana today.  Lungs are clear on x-ray, no evidence of pneumothorax or other lung pathology.  Patient was mildly tachycardic at arrival but admitted to being very anxious.  This has resolved without intervention.  D-dimer is negative, doubt PE.  Patient very low risk by Wells criteria.  Troponin negative x 2.  Patient reassured, symptoms not cardiac in nature and do not require any further workup.        Final Clinical Impression(s) / ED Diagnoses Final diagnoses:  Atypical chest pain    Rx / DC Orders ED Discharge Orders          Ordered    famotidine (PEPCID) 20 MG tablet  2 times daily        03/18/23 0235              Gilda Crease, MD 03/18/23 [phone]

## 2023-05-05 LAB — COMPREHENSIVE METABOLIC PANEL
ALT: 73 IU/L — ABNORMAL HIGH (ref 0–44)
AST: 33 IU/L (ref 0–40)
Albumin: 4.7 g/dL (ref 4.3–5.2)
Alkaline Phosphatase: 88 IU/L (ref 44–121)
BUN/Creatinine Ratio: 17 (ref 9–20)
BUN: 14 mg/dL (ref 6–20)
Bilirubin Total: 0.8 mg/dL (ref 0.0–1.2)
CO2: 23 mmol/L (ref 20–29)
Calcium: 9.7 mg/dL (ref 8.7–10.2)
Chloride: 101 mmol/L (ref 96–106)
Creatinine, Ser: 0.82 mg/dL (ref 0.76–1.27)
Globulin, Total: 2.3 g/dL (ref 1.5–4.5)
Glucose: 115 mg/dL — ABNORMAL HIGH (ref 70–99)
Potassium: 4.4 mmol/L (ref 3.5–5.2)
Sodium: 140 mmol/L (ref 134–144)
Total Protein: 7 g/dL (ref 6.0–8.5)
eGFR: 125 mL/min/{1.73_m2} (ref >59)

## 2023-05-05 LAB — LIPID PANEL
Chol/HDL Ratio: 2.1 ratio (ref 0.0–5.0)
Cholesterol, Total: 135 mg/dL (ref 100–199)
HDL: 63 mg/dL (ref >39)
LDL Chol Calc (NIH): 62 mg/dL (ref 0–99)
Triglycerides: 41 mg/dL (ref 0–149)
VLDL Cholesterol Cal: 10 mg/dL (ref 5–40)

## 2023-05-05 LAB — TSH: TSH: 1.61 u[IU]/mL (ref 0.450–4.500)

## 2023-05-05 LAB — VITAMIN D 25 HYDROXY (VIT D DEFICIENCY, FRACTURES): Vit D, 25-Hydroxy: 26.3 ng/mL — ABNORMAL LOW (ref 30.0–100.0)

## 2023-05-05 LAB — T4, FREE: Free T4: 1.27 ng/dL (ref 0.82–1.77)

## 2023-05-11 ENCOUNTER — Ambulatory Visit (INDEPENDENT_AMBULATORY_CARE_PROVIDER_SITE_OTHER): Payer: Commercial Managed Care - PPO | Admitting: Nurse Practitioner

## 2023-05-11 ENCOUNTER — Encounter: Payer: Self-pay | Admitting: Nurse Practitioner

## 2023-05-11 VITALS — BP 138/66 | HR 78 | Ht 66.0 in | Wt 156.4 lb

## 2023-05-11 DIAGNOSIS — E559 Vitamin D deficiency, unspecified: Secondary | ICD-10-CM

## 2023-05-11 DIAGNOSIS — R5383 Other fatigue: Secondary | ICD-10-CM

## 2023-05-11 DIAGNOSIS — E109 Type 1 diabetes mellitus without complications: Secondary | ICD-10-CM | POA: Diagnosis not present

## 2023-05-11 MED ORDER — INSULIN LISPRO 100 UNIT/ML IJ SOLN: INTRAMUSCULAR | 3 refills | Status: DC

## 2023-05-11 NOTE — Progress Notes (Signed)
 05/11/2023       Endocrinology follow-up note   Subjective:    Patient ID: Ray Espinoza, male    DOB: [birth year].  He is here to follow-up for management of type 1 diabetes requested by  Assunta Found, MD  Past Medical History:  Diagnosis Date   Diabetes mellitus without complication [facility])    History reviewed. No pertinent surgical history. Social History   Socioeconomic History   Marital status: Single    Spouse name: Not on file   Number of children: Not on file   Years of education: Not on file   Highest education level: Not on file  Occupational History   Not on file  Tobacco Use   Smoking status: Former    Types: Cigarettes   Smokeless tobacco: Never  Vaping Use   Vaping status: Never Used  Substance and Sexual Activity   Alcohol use: No   Drug use: Yes    Types: Marijuana   Sexual activity: Not on file  Other Topics Concern   Not on file  Social History Narrative   Not on file   Social Drivers of Health   Financial Resource Strain: Not on file  Food Insecurity: Not on file  Transportation Needs: Not on file  Physical Activity: Not on file  Stress: Not on file  Social Connections: Not on file   Outpatient Encounter Medications as of 05/11/2023  Medication Sig   Continuous Glucose Sensor (DEXCOM G7 SENSOR) MISC Inject 1 Application into the skin as directed. Change sensor every 10 days as directed.   famotidine (PEPCID) 20 MG tablet Take 1 tablet (20 mg total) by mouth 2 (two) times daily.   GLOBAL EASE INJECT PEN NEEDLES 31G X 5 MM MISC USE AS DIRECTED FOUR TIMES DAILY   Insulin Disposable Pump (OMNIPOD 5 DEXG7G6 PODS GEN 5) MISC CHANGE pod EVERY 48 TO 72 hours   Insulin Disposable Pump (OMNIPOD 5 G6 INTRO, GEN 5,) KIT CHANGE pod every 48-72 hours   olmesartan (BENICAR) 20 MG tablet Take 20 mg by mouth daily.   [DISCONTINUED] insulin lispro (HUMALOG) 100 UNIT/ML injection USE WITH insulin pump FOR total daily DOSE around 66 UNITS PER DAY    insulin lispro (HUMALOG) 100 UNIT/ML injection USE WITH insulin pump FOR total daily DOSE around 90 UNITS PER DAY   No facility-administered encounter medications on file as of 05/11/2023.   ALLERGIES: Allergies  Allergen Reactions   Codeine Hives and Itching   Sulfamethoxazole     Other reaction(s): GI Upset (intolerance)   VACCINATION STATUS:  There is no immunization history on file for this patient.  Diabetes He presents for his follow-up diabetic visit. He has type 1 diabetes mellitus. Onset time: He was diagnosed at approximate age of 14 years. His disease course has been improving. There are no hypoglycemic associated symptoms. Pertinent negatives for hypoglycemia include no confusion, headaches, pallor or seizures. Associated symptoms include fatigue. Pertinent negatives for diabetes include no chest pain, no polydipsia, no polyphagia, no polyuria and no weakness. There are no hypoglycemic complications. Symptoms are improving. There are no diabetic complications. Risk factors for coronary artery disease include diabetes mellitus and male sex. Current diabetic treatment includes insulin pump. He is compliant with treatment most of the time. His weight is fluctuating minimally. He is following a generally unhealthy diet. When asked about meal planning, he reported none. He has not had a previous visit with a dietitian. He participates in exercise intermittently. His home blood glucose trend  is decreasing steadily. His overall blood glucose range is 140-180 mg/dl. (He presents today with his CGM and Omnipod 5 showing drastically improved glycemic profile overall.  His POCT A1c today is 6.3%, improving from last visit of 7.1%.  He notes he has really changed his diet, trying to avoid added salt which he has leaned more toward carbs as a result.  Analysis of his CGM shows TIR 56%, TAR 39%, TBR 5%.  He does note that his Omnipog 5 and G7 sometimes lose connection.) An ACE inhibitor/angiotensin II  receptor blocker is contraindicated. He does not see a podiatrist.Eye exam is not current.    Review of systems  Constitutional: + Minimally fluctuating body weight,  current Body mass index is 25.24 kg/m. , no fatigue, no subjective hyperthermia, no subjective hypothermia Eyes: no blurry vision, no xerophthalmia ENT: no sore throat, no nodules palpated in throat, no dysphagia/odynophagia, no hoarseness Cardiovascular: no chest pain, no shortness of breath, no palpitations, no leg swelling Respiratory: no cough, no shortness of breath Gastrointestinal: no nausea/vomiting/diarrhea Musculoskeletal: no muscle/joint aches Skin: no rashes, no hyperemia Neurological: no tremors, no numbness, no tingling, no dizziness Psychiatric: no depression, no anxiety   Objective:    BP 138/66 (BP Location: Right Arm, Patient Position: Sitting, Cuff Size: Large)   Pulse 78   Ht 5\' 6"  (1.676 m)   Wt 156 lb 6.4 oz (70.9 kg)   BMI 25.24 kg/m   Wt Readings from Last 3 Encounters:  05/11/23 156 lb 6.4 oz (70.9 kg)  03/17/23 150 lb (68 kg)  01/11/23 156 lb (70.8 kg)    BP Readings from Last 3 Encounters:  05/11/23 138/66  03/18/23 132/84  01/11/23 134/77     Physical Exam- Limited  Constitutional:  Body mass index is 25.24 kg/m. , not in acute distress, normal state of mind Eyes:  EOMI, no exophthalmos Musculoskeletal: no gross deformities, strength intact in all four extremities, no gross restriction of joint movements Skin:  no rashes, no hyperemia Neurological: no tremor with outstretched hands    Diabetic Foot Exam - Simple   No data filed      Diabetic Labs (most recent): Lab Results  Component Value Date   HGBA1C 7.1 (A) 01/11/2023   HGBA1C 6.7 (A) 07/08/2022   HGBA1C 6.7 (A) 12/30/2021   MICROALBUR 65.9 08/10/2019   MICROALBUR 35.4 09/15/2018   Recent Results (from the past 2160 hours)  CBC with Differential/Platelet     Status: None   Collection Time: 03/18/23 12:36  AM  Result Value Ref Range   WBC 6.4 4.0 - 10.5 K/uL   RBC 4.57 4.22 - 5.81 MIL/uL   Hemoglobin 13.3 13.0 - 17.0 g/dL   HCT 56.2 13.0 - 86.5 %   MCV 86.4 80.0 - 100.0 fL   MCH 29.1 26.0 - 34.0 pg   MCHC 33.7 30.0 - 36.0 g/dL   RDW 78.4 69.6 - 29.5 %   Platelets 204 150 - 400 K/uL   nRBC 0.0 0.0 - 0.2 %   Neutrophils Relative % 73 %   Neutro Abs 4.8 1.7 - 7.7 K/uL   Lymphocytes Relative 16 %   Lymphs Abs 1.0 0.7 - 4.0 K/uL   Monocytes Relative 8 %   Monocytes Absolute 0.5 0.1 - 1.0 K/uL   Eosinophils Relative 1 %   Eosinophils Absolute 0.1 0.0 - 0.5 K/uL   Basophils Relative 1 %   Basophils Absolute 0.0 0.0 - 0.1 K/uL   Immature Granulocytes 1 %  Abs Immature Granulocytes 0.04 0.00 - 0.07 K/uL    Comment: Performed at [facility], [address], [place], [place] [address]  Comprehensive metabolic panel     Status: Abnormal   Collection Time: 03/18/23 12:36 AM  Result Value Ref Range   Sodium 138 135 - 145 mmol/L   Potassium 3.9 3.5 - 5.1 mmol/L   Chloride 103 98 - 111 mmol/L   CO2 25 22 - 32 mmol/L   Glucose, Bld 143 (H) 70 - 99 mg/dL    Comment: Glucose reference range applies only to samples taken after fasting for at least 8 hours.   BUN 16 6 - 20 mg/dL   Creatinine, Ser 8.11 0.61 - 1.24 mg/dL   Calcium 9.8 8.9 - 91.4 mg/dL   Total Protein 7.3 6.5 - 8.1 g/dL   Albumin 4.5 3.5 - 5.0 g/dL   AST 21 15 - 41 U/L   ALT 23 0 - 44 U/L   Alkaline Phosphatase 54 38 - 126 U/L   Total Bilirubin 0.8 0.0 - 1.2 mg/dL   GFR, Estimated >78 >29 mL/min    Comment: (NOTE) Calculated using the CKD-EPI Creatinine Equation (2021)    Anion gap 10 5 - 15    Comment: Performed at [facility], [address], [place], [place] [address]  Lipase, blood     Status: None   Collection Time: 03/18/23 12:36 AM  Result Value Ref Range   Lipase 25 11 - 51 U/L    Comment: Performed at [facility], [address], [place], [place] [address]  Troponin I (High Sensitivity)     Status: None    Collection Time: 03/18/23 12:36 AM  Result Value Ref Range   Troponin I (High Sensitivity) <2 <18 ng/L    Comment: (NOTE) Elevated high sensitivity troponin I (hsTnI) values and significant  changes across serial measurements may suggest ACS but many other  chronic and acute conditions are known to elevate hsTnI results.  Refer to the "Links" section for chest pain algorithms and additional  guidance. Performed at [facility], [address], [place], [place] [address]   D-dimer, quantitative     Status: None   Collection Time: 03/18/23 12:36 AM  Result Value Ref Range   D-Dimer, Quant <0.27 0.00 - 0.50 ug/mL-FEU    Comment: (NOTE) At the manufacturer cut-off value of 0.5 g/mL FEU, this assay has a negative predictive value of 95-100%.This assay is intended for use in conjunction with a clinical pretest probability (PTP) assessment model to exclude pulmonary embolism (PE) and deep venous thrombosis (DVT) in outpatients suspected of PE or DVT. Results should be correlated with clinical presentation. Performed at [facility], [address], [place], [place] [address]   Troponin I (High Sensitivity)     Status: None   Collection Time: 03/18/23  2:03 AM  Result Value Ref Range   Troponin I (High Sensitivity) <2 <18 ng/L    Comment: (NOTE) Elevated high sensitivity troponin I (hsTnI) values and significant  changes across serial measurements may suggest ACS but many other  chronic and acute conditions are known to elevate hsTnI results.  Refer to the "Links" section for chest pain algorithms and additional  guidance. Performed at [facility], [address], [place], [place] [address]   Comprehensive metabolic panel     Status: Abnormal   Collection Time: 05/04/23  9:02 AM  Result Value Ref Range   Glucose 115 (H) 70 - 99 mg/dL   BUN 14 6 -  20 mg/dL   Creatinine, Ser 1.61 0.76 - 1.27 mg/dL   eGFR 096 >04 VW/UJW/1.19   BUN/Creatinine Ratio 17 9 - 20   Sodium 140 134 - 144  mmol/L   Potassium 4.4 3.5 - 5.2 mmol/L   Chloride 101 96 - 106 mmol/L   CO2 23 20 - 29 mmol/L   Calcium 9.7 8.7 - 10.2 mg/dL   Total Protein 7.0 6.0 - 8.5 g/dL   Albumin 4.7 4.3 - 5.2 g/dL   Globulin, Total 2.3 1.5 - 4.5 g/dL   Bilirubin Total 0.8 0.0 - 1.2 mg/dL   Alkaline Phosphatase 88 44 - 121 IU/L   AST 33 0 - 40 IU/L   ALT 73 (H) 0 - 44 IU/L  Lipid panel     Status: None   Collection Time: 05/04/23  9:02 AM  Result Value Ref Range   Cholesterol, Total 135 100 - 199 mg/dL   Triglycerides 41 0 - 149 mg/dL   HDL 63 >14 mg/dL   VLDL Cholesterol Cal 10 5 - 40 mg/dL   LDL Chol Calc (NIH) 62 0 - 99 mg/dL   Chol/HDL Ratio 2.1 0.0 - 5.0 ratio    Comment:                                   T. Chol/HDL Ratio                                             Men  Women                               1/2 Avg.Risk  3.4    3.3                                   Avg.Risk  5.0    4.4                                2X Avg.Risk  9.6    7.1                                3X Avg.Risk 23.4   11.0   T4, free     Status: None   Collection Time: 05/04/23  9:02 AM  Result Value Ref Range   Free T4 1.27 0.82 - 1.77 ng/dL  TSH     Status: None   Collection Time: 05/04/23  9:02 AM  Result Value Ref Range   TSH 1.610 0.450 - 4.500 uIU/mL  VITAMIN D 25 Hydroxy (Vit-D Deficiency, Fractures)     Status: Abnormal   Collection Time: 05/04/23  9:02 AM  Result Value Ref Range   Vit D, 25-Hydroxy 26.3 (L) 30.0 - 100.0 ng/mL    Comment: Vitamin D deficiency has been defined by the Institute of Medicine and an Endocrine Society practice guideline as a level of serum 25-OH vitamin D less than 20 ng/mL (1,2). The Endocrine Society went on to further define vitamin D insufficiency as a level between 21 and 29 ng/mL (2). 1. IOM (  Institute of Medicine). 2010. Dietary reference    intakes for calcium and D. [place]: The    [employer]. 2. Holick MF, Binkley [place], Bischoff-Ferrari HA, et al.     Evaluation, treatment, and prevention of vitamin D    deficiency: an Endocrine Society clinical practice    guideline. JCEM. 2011 Jul; 96(7):1911-30.          Latest Ref Rng & Units 05/04/2023    9:02 AM 03/18/2023   12:36 AM 06/30/2022    8:27 AM  CMP  Glucose 70 - 99 mg/dL 295  621  308   BUN 6 - 20 mg/dL 14  16  14    Creatinine 0.76 - 1.27 mg/dL 6.57  8.46  9.62   Sodium 134 - 144 mmol/L 140  138  138   Potassium 3.5 - 5.2 mmol/L 4.4  3.9  4.5   Chloride 96 - 106 mmol/L 101  103  102   CO2 20 - 29 mmol/L 23  25  22    Calcium 8.7 - 10.2 mg/dL 9.7  9.8  9.6   Total Protein 6.0 - 8.5 g/dL 7.0  7.3  6.9   Total Bilirubin 0.0 - 1.2 mg/dL 0.8  0.8  0.8   Alkaline Phos 44 - 121 IU/L 88  54  74   AST 0 - 40 IU/L 33  21  31   ALT 0 - 44 IU/L 73  23  42      Results for BERKELEY, VANAKEN (MRN [id number]) as of 04/20/2019 10:10  Ref. Range 11/14/2009 04:30 12/15/2015 10:03  TSH Latest Ref Range: 0.50 - 4.30 mIU/L 2.350 2.60  T4,Free(Direct) Latest Ref Range: 0.8 - 1.4 ng/dL 4.01 1.2      Assessment & Plan:   1) Controlled type 1 diabetes mellitus   - Patient has currently uncontrolled symptomatic type 1 DM since  28 years of age.  He presents today with his CGM and Omnipod 5 showing drastically improved glycemic profile overall.  His POCT A1c today is 6.3%, improving from last visit of 7.1%.  He notes he has really changed his diet, trying to avoid added salt which he has leaned more toward carbs as a result.  Analysis of his CGM shows TIR 56%, TAR 39%, TBR 5%.  He does note that his Omnipog 5 and G7 sometimes lose connection.  - I reviewed his most recent labs with him.    He does not report any gross competitions of diabetes, however, patient remains at a high risk for more acute and chronic complications of diabetes which include CAD, CVA, CKD, retinopathy, and neuropathy. These are all discussed in detail with the patient.  - Nutritional counseling repeated at each  appointment due to patients tendency to fall back in to old habits.  - The patient admits there is a room for improvement in their diet and drink choices. -  Suggestion is made for the patient to avoid simple carbohydrates from their diet including Cakes, Sweet Desserts / Pastries, Ice Cream, Soda (diet and regular), Sweet Tea, Candies, Chips, Cookies, Sweet Pastries, Store Bought Juices, Alcohol in Excess of 1-2 drinks a day, Artificial Sweeteners, Coffee Creamer, and "Sugar-free" Products. This will help patient to have stable blood glucose profile and potentially avoid unintended weight gain.   - I encouraged the patient to switch to unprocessed or minimally processed complex starch and increased protein intake (animal or plant source), fruits, and vegetables.   - Patient is advised to  stick to a routine mealtimes to eat 3 meals a day and avoid unnecessary snacks (to snack only to correct hypoglycemia).  - I have approached patient with the following individualized plan to manage diabetes and patient agrees:   -He is advised to continue using his Omnipod 5.  I did not make any changes to settings today.  -He is advised to continue monitoring blood glucose at least 4 times per day (using his CGM), before meals and at bedtime and call the clinic if blood glucose is less than 70 or greater than 200 for 3 tests in a row.    - Insulin is the exclusive choice of therapy for his type 1 diabetes.  - Patient specific target  A1c;  LDL, HDL, Triglycerides, and  Waist Circumference were discussed in detail.  2) BP/HTN:  His blood pressure is controlled to target without the use of antihypertensive medications.  3) Lipids/HPL:  His most recent lipid panel from 05/04/23 shows controlled LDL of 62.  He is not any cholesterol lowering agents at this time.     4)  Weight/Diet:  His Body mass index is 25.24 kg/m.  He is not a candidate for weight loss. CDE Consult is initiated, exercise, and detailed  carbohydrates information provided.  5) Chronic Care/Health Maintenance: -Patient is encouraged to continue to follow up with Ophthalmology, Podiatrist at least yearly or according to recommendations, and advised to stay away from smoking. I have recommended yearly flu vaccine and pneumonia vaccination at least every 5 years; moderate intensity exercise for up to 150 minutes weekly; and  sleep for at least 7 hours a day.  6) Vitamin D Deficiency His recent vitamin D level on 05/04/23 was 26.3.  He is NOT currently taking OTC Vitamin D3 supplement.   I recommended he start OTC Vitamin D3 5000 units daily.     - I advised patient to maintain close follow up with Assunta Found, MD for primary care needs.     I spent  30  minutes in the care of the patient today including review of labs from CMP, Lipids, Thyroid Function, Hematology (current and previous including abstractions from other facilities); face-to-face time discussing  his blood glucose readings/logs, discussing hypoglycemia and hyperglycemia episodes and symptoms, medications doses, his options of short and long term treatment based on the latest standards of care / guidelines;  discussion about incorporating lifestyle medicine;  and documenting the encounter. Risk reduction counseling performed per USPSTF guidelines to reduce obesity and cardiovascular risk factors.     Please refer to Patient Instructions for Blood Glucose Monitoring and Insulin/Medications Dosing Guide"  in media tab for additional information. Please  also refer to " Patient Self Inventory" in the Media  tab for reviewed elements of pertinent patient history.  Raoul Pitch Ehler participated in the discussions, expressed understanding, and voiced agreement with the above plans.  All questions were answered to his satisfaction. he is encouraged to contact clinic should he have any questions or concerns prior to his return visit.   Follow up plan: - Return in about  4 months (around 09/10/2023) for Diabetes F/U with A1c in office, No previsit labs, Bring meter and logs.  Ronny Bacon, [facility] [address] [place], [place] [address] Phone: [phone] Fax: [phone]   05/11/2023, 4:49 PM

## 2023-05-25 ENCOUNTER — Other Ambulatory Visit: Payer: Self-pay

## 2023-05-25 ENCOUNTER — Emergency Department (HOSPITAL_BASED_OUTPATIENT_CLINIC_OR_DEPARTMENT_OTHER)

## 2023-05-25 ENCOUNTER — Encounter (HOSPITAL_BASED_OUTPATIENT_CLINIC_OR_DEPARTMENT_OTHER): Payer: Self-pay

## 2023-05-25 ENCOUNTER — Emergency Department (HOSPITAL_BASED_OUTPATIENT_CLINIC_OR_DEPARTMENT_OTHER)
Admission: EM | Admit: 2023-05-25 | Discharge: 2023-05-25 | Disposition: A | Discharge status: Home / Self Care | Attending: Emergency Medicine | Admitting: Emergency Medicine

## 2023-05-25 DIAGNOSIS — W228XXA Striking against or struck by other objects, initial encounter: Secondary | ICD-10-CM | POA: Insufficient documentation

## 2023-05-25 DIAGNOSIS — E119 Type 2 diabetes mellitus without complications: Secondary | ICD-10-CM | POA: Insufficient documentation

## 2023-05-25 DIAGNOSIS — M79675 Pain in left toe(s): Secondary | ICD-10-CM | POA: Diagnosis present

## 2023-05-25 DIAGNOSIS — Z794 Long term (current) use of insulin: Secondary | ICD-10-CM | POA: Insufficient documentation

## 2023-05-25 NOTE — ED Triage Notes (Addendum)
 In for eval of left 4th toe and foot pain. Stumped his left 4th toe approx 3 weeks ago and stepped on it wrong today and felt a pain shoot up his foot. Had been buddy taping his toe. Ibuprofen today at approx 0730. Toe is still bruised, red, and slightly swollen.

## 2023-05-25 NOTE — Discharge Instructions (Addendum)
 It was a pleasure taking part in your care.  As discussed, the x-ray today is negative for any kind of acute fracture.  Please take ibuprofen or Tylenol every 6 hours as needed for pain in your left fourth toe.  Please wear postop shoe during work to help with support.  Continue buddy taping.  Please follow-up with your doctor for further management.  Return to the ER with any new or worsening symptoms.

## 2023-05-25 NOTE — ED Provider Notes (Signed)
 [facility] Provider Note   CSN: [id number] Arrival date & time: 05/25/23  6440     History  Chief Complaint  Patient presents with   Foot Pain    Ray Espinoza is a 28 y.o. male with medical history diabetes.  Patient presents to ED for evaluation of left fourth toe injury.  Patient reports that a few weeks ago he stubbed his left fourth toe on his daughter's rocking chair.  He states that he had pain in that area, he had been buddy taping his left toe however the pain has recently been decreasing.  The patient states that today he "stepped on it wrong" at work and he had pain that shot up his left foot and he just "wanted to get checked out".  Patient denies any history of surgical intervention to his left foot.  He denies any medication at home.  He denies any trauma to his left foot other than striking it on a rocking chair some number of weeks ago.  Denies any recurrence of injury today.  Reports buddy taping has been helping with pain.   Foot Pain       Home Medications Prior to Admission medications   Medication Sig Start Date End Date Taking? Authorizing Provider  Continuous Glucose Sensor (DEXCOM G7 SENSOR) MISC Inject 1 Application into the skin as directed. Change sensor every 10 days as directed. 01/11/23  Yes Dani Gobble, NP  GLOBAL EASE INJECT PEN NEEDLES 31G X 5 MM MISC USE AS DIRECTED FOUR TIMES DAILY 04/03/21  Yes Dani Gobble, NP  Insulin Disposable Pump (OMNIPOD 5 DEXG7G6 PODS GEN 5) MISC CHANGE pod EVERY 48 TO 72 hours 12/30/22  Yes Reardon, Freddi Starr, NP  Insulin Disposable Pump (OMNIPOD 5 G6 INTRO, GEN 5,) KIT CHANGE pod every 48-72 hours 07/28/21  Yes Reardon, Alphonzo Lemmings J, NP  insulin lispro (HUMALOG) 100 UNIT/ML injection USE WITH insulin pump FOR total daily DOSE around 90 UNITS PER DAY 05/11/23  Yes Reardon, Alphonzo Lemmings J, NP  olmesartan (BENICAR) 20 MG tablet Take 20 mg by mouth daily. 04/21/23  Yes  [provider]  famotidine (PEPCID) 20 MG tablet Take 1 tablet (20 mg total) by mouth 2 (two) times daily. 03/18/23   Gilda Crease, MD      Allergies    Codeine and Sulfamethoxazole    Review of Systems   Review of Systems  Musculoskeletal:  Positive for arthralgias and myalgias.  All other systems reviewed and are negative.   Physical Exam Updated Vital Signs BP (!) 146/75 (BP Location: Right Arm)   Pulse 94   Temp 98.1 F (36.7 C) (Oral)   Resp 16   Ht 5\' 7"  (1.702 m)   Wt 72.6 kg   SpO2 100%   BMI 25.06 kg/m  Physical Exam Vitals and nursing note reviewed.  Constitutional:      General: He is not in acute distress.    Appearance: He is well-developed.  HENT:     Head: Normocephalic and atraumatic.  Eyes:     Conjunctiva/sclera: Conjunctivae normal.  Cardiovascular:     Rate and Rhythm: Normal rate and regular rhythm.     Heart sounds: No murmur heard. Pulmonary:     Effort: Pulmonary effort is normal. No respiratory distress.     Breath sounds: Normal breath sounds.  Abdominal:     Palpations: Abdomen is soft.     Tenderness: There is no abdominal tenderness.  Musculoskeletal:        General: No swelling.     Cervical back: Neck supple.     Comments: Redness of dorsum of left fourth toe.  No obvious deformity.  2+ DP pulse in the left foot.  Brisk cap refill.  N/V intact.  Skin:    General: Skin is warm and dry.     Capillary Refill: Capillary refill takes less than 2 seconds.  Neurological:     Mental Status: He is alert and oriented to person, place, and time.  Psychiatric:        Mood and Affect: Mood normal.     ED Results / Procedures / Treatments   Labs (all labs ordered are listed, but only abnormal results are displayed) Labs Reviewed - No data to display  EKG None  Radiology DG Toe 4th Left Result Date: 05/25/2023 CLINICAL DATA:  Pain after trauma EXAM: LEFT FOURTH TOE three views COMPARISON:  None Available. FINDINGS:  There is no evidence of fracture or dislocation. There is no evidence of arthropathy or other focal bone abnormality. Soft tissues are unremarkable. IMPRESSION: No acute osseous abnormality. Electronically Signed   By: Karen Kays M.D.   On: 05/25/2023 11:08    Procedures Procedures   Medications Ordered in ED Medications - No data to display  ED Course/ Medical Decision Making/ A&P  Medical Decision Making Amount and/or Complexity of Data Reviewed Radiology: ordered.   28 year old male presents for evaluation.  Please see HPI for further details.  On examination the patient is afebrile and nontachycardic.  Lung sounds are clear bilaterally, he is not hypoxic.  Abdomen is soft and compressible.  Neurological examinations at baseline.  Patient left fourth toe does have erythema to the dorsum.  There is no obvious deformity.  He is a 2+ DP pulse, brisk cap refill to the fourth left toe.  Patient x-ray imaging negative for acute fracture.  Patient placed in postop shoe, advised to continue with buddy taping.  Patient encouraged to follow-up with his PCP.  Encouraged to take Tylenol or ibuprofen in the interim for pain.  He had all of his questions answered to his satisfaction.  He is stable to discharge home.   Final Clinical Impression(s) / ED Diagnoses Final diagnoses:  Pain of toe of left foot    Rx / DC Orders ED Discharge Orders     None         Al Decant, PA-C 05/25/23 1120    Ernie Avena, MD 05/25/23 1303

## 2023-09-13 ENCOUNTER — Encounter: Payer: Self-pay | Admitting: Nurse Practitioner

## 2023-09-13 ENCOUNTER — Ambulatory Visit (INDEPENDENT_AMBULATORY_CARE_PROVIDER_SITE_OTHER): Admitting: Nurse Practitioner

## 2023-09-13 VITALS — BP 120/82 | HR 74 | Ht 66.0 in | Wt 181.2 lb

## 2023-09-13 DIAGNOSIS — E559 Vitamin D deficiency, unspecified: Secondary | ICD-10-CM

## 2023-09-13 DIAGNOSIS — Z794 Long term (current) use of insulin: Secondary | ICD-10-CM

## 2023-09-13 DIAGNOSIS — E109 Type 1 diabetes mellitus without complications: Secondary | ICD-10-CM

## 2023-09-13 DIAGNOSIS — R5383 Other fatigue: Secondary | ICD-10-CM

## 2023-09-13 LAB — POCT GLYCOSYLATED HEMOGLOBIN (HGB A1C): Hemoglobin A1C: 6.4 % — AB (ref 4.0–5.6)

## 2023-09-13 MED ORDER — INSULIN LISPRO 100 UNIT/ML IJ SOLN: INTRAMUSCULAR | 3 refills | Status: AC

## 2023-09-13 MED ORDER — DEXCOM G7 SENSOR MISC: 1.0000 | 3 refills | Status: AC

## 2023-09-13 NOTE — Progress Notes (Unsigned)
 09/14/2023       Endocrinology follow-up note   Subjective:    Patient ID: Ray Espinoza, male    DOB: [birth year].  He is here to follow-up for management of type 1 diabetes requested by  Marvine Rush, MD  Past Medical History:  Diagnosis Date   Diabetes mellitus without complication [facility])    History reviewed. No pertinent surgical history. Social History   Socioeconomic History   Marital status: Single    Spouse name: Not on file   Number of children: Not on file   Years of education: Not on file   Highest education level: Not on file  Occupational History   Not on file  Tobacco Use   Smoking status: Former    Types: Cigarettes   Smokeless tobacco: Never  Vaping Use   Vaping status: Never Used  Substance and Sexual Activity   Alcohol use: Not Currently    Comment: socially   Drug use: Not Currently    Types: Marijuana    Comment: stopped using 3 months ago   Sexual activity: Not on file  Other Topics Concern   Not on file  Social History Narrative   Not on file   Social Drivers of Health   Financial Resource Strain: Not on file  Food Insecurity: Not on file  Transportation Needs: Not on file  Physical Activity: Not on file  Stress: Not on file  Social Connections: Not on file   Outpatient Encounter Medications as of 09/13/2023  Medication Sig   famotidine  (PEPCID ) 20 MG tablet Take 1 tablet (20 mg total) by mouth 2 (two) times daily.   GLOBAL EASE INJECT PEN NEEDLES 31G X 5 MM MISC USE AS DIRECTED FOUR TIMES DAILY   Insulin  Disposable Pump (OMNIPOD 5 DEXG7G6 PODS GEN 5) MISC CHANGE pod EVERY 48 TO 72 hours   Insulin  Disposable Pump (OMNIPOD 5 G6 INTRO, GEN 5,) KIT CHANGE pod every 48-72 hours   olmesartan (BENICAR) 20 MG tablet Take 20 mg by mouth daily.   [DISCONTINUED] Continuous Glucose Sensor (DEXCOM G7 SENSOR) MISC Inject 1 Application into the skin as directed. Change sensor every 10 days as directed.   [DISCONTINUED] insulin  lispro (HUMALOG )  100 UNIT/ML injection USE WITH insulin  pump FOR total daily DOSE around 90 UNITS PER DAY   Continuous Glucose Sensor (DEXCOM G7 SENSOR) MISC Inject 1 Application into the skin as directed. Change sensor every 10 days as directed.   insulin  lispro (HUMALOG ) 100 UNIT/ML injection USE WITH insulin  pump FOR total daily DOSE around 110 UNITS PER DAY   No facility-administered encounter medications on file as of 09/13/2023.   ALLERGIES: Allergies  Allergen Reactions   Codeine Hives and Itching   Sulfamethoxazole     Other reaction(s): GI Upset (intolerance)   VACCINATION STATUS:  There is no immunization history on file for this patient.  Diabetes He presents for his follow-up diabetic visit. He has type 1 diabetes mellitus. Onset time: He was diagnosed at approximate age of 14 years. His disease course has been fluctuating. There are no hypoglycemic associated symptoms. Pertinent negatives for hypoglycemia include no confusion, headaches, pallor or seizures. Associated symptoms include fatigue. Pertinent negatives for diabetes include no chest pain, no polydipsia, no polyphagia, no polyuria and no weakness. There are no hypoglycemic complications. Symptoms are improving. There are no diabetic complications. Risk factors for coronary artery disease include diabetes mellitus and male sex. Current diabetic treatment includes insulin  pump. He is compliant with treatment most of the  time. His weight is fluctuating minimally. He is following a generally unhealthy diet. When asked about meal planning, he reported none. He has not had a previous visit with a dietitian. He participates in exercise intermittently. His home blood glucose trend is decreasing steadily. His overall blood glucose range is 140-180 mg/dl. (He presents today with his CGM and Omnipod 5 showing fluctuating but overall stable glycemic profile overall.  His POCT A1c today is 6.4%, essentially unchanged from last visit.  He notes he has been  eating much larger quantities recently, has been more stressed than usual.   Analysis of his CGM shows TIR 56%, TAR 41%, TBR 3%.  He does note that his Omnipod 5 and G7 sometimes loses connection.) An ACE inhibitor/angiotensin II receptor blocker is contraindicated. He does not see a podiatrist.Eye exam is not current.    Review of systems  Constitutional: + increasing body weight,  current Body mass index is 29.25 kg/m. , no fatigue, no subjective hyperthermia, no subjective hypothermia Eyes: no blurry vision, no xerophthalmia ENT: no sore throat, no nodules palpated in throat, no dysphagia/odynophagia, no hoarseness Cardiovascular: no chest pain, no shortness of breath, no palpitations, no leg swelling Respiratory: no cough, no shortness of breath Gastrointestinal: no nausea/vomiting/diarrhea Musculoskeletal: no muscle/joint aches Skin: no rashes, no hyperemia Neurological: no tremors, no numbness, no tingling, no dizziness Psychiatric: no depression, no anxiety, + increased stress   Objective:    BP 120/82 (BP Location: Left Arm, Patient Position: Sitting, Cuff Size: Large)   Pulse 74   Ht 5' 6 (1.676 m)   Wt 181 lb 3.2 oz (82.2 kg)   BMI 29.25 kg/m   Wt Readings from Last 3 Encounters:  09/13/23 181 lb 3.2 oz (82.2 kg)  05/25/23 160 lb (72.6 kg)  05/11/23 156 lb 6.4 oz (70.9 kg)    BP Readings from Last 3 Encounters:  09/13/23 120/82  05/25/23 (!) 146/75  05/11/23 138/66     Physical Exam- Limited  Constitutional:  Body mass index is 29.25 kg/m. , not in acute distress, normal state of mind Eyes:  EOMI, no exophthalmos Musculoskeletal: no gross deformities, strength intact in all four extremities, no gross restriction of joint movements Skin:  no rashes, no hyperemia Neurological: no tremor with outstretched hands    Diabetic Foot Exam - Simple   No data filed      Diabetic Labs (most recent): Lab Results  Component Value Date   HGBA1C 6.4 (A) 09/13/2023    HGBA1C 7.1 (A) 01/11/2023   HGBA1C 6.7 (A) 07/08/2022   MICROALBUR 65.9 08/10/2019   MICROALBUR 35.4 09/15/2018   Recent Results (from the past 2160 hours)  HgB A1c     Status: Abnormal   Collection Time: 09/13/23  4:01 PM  Result Value Ref Range   Hemoglobin A1C 6.4 (A) 4.0 - 5.6 %   HbA1c POC (<> result, manual entry)     HbA1c, POC (prediabetic range)     HbA1c, POC (controlled diabetic range)            Latest Ref Rng & Units 05/04/2023    9:02 AM 03/18/2023   12:36 AM 06/30/2022    8:27 AM  CMP  Glucose 70 - 99 mg/dL 884  856  821   BUN 6 - 20 mg/dL 14  16  14    Creatinine 0.76 - 1.27 mg/dL 9.17  9.27  9.24   Sodium 134 - 144 mmol/L 140  138  138   Potassium 3.5 -  5.2 mmol/L 4.4  3.9  4.5   Chloride 96 - 106 mmol/L 101  103  102   CO2 20 - 29 mmol/L 23  25  22    Calcium 8.7 - 10.2 mg/dL 9.7  9.8  9.6   Total Protein 6.0 - 8.5 g/dL 7.0  7.3  6.9   Total Bilirubin 0.0 - 1.2 mg/dL 0.8  0.8  0.8   Alkaline Phos 44 - 121 IU/L 88  54  74   AST 0 - 40 IU/L 33  21  31   ALT 0 - 44 IU/L 73  23  42      Results for VICTORIA, EUCEDA (MRN [id number]) as of 04/20/2019 10:10  Ref. Range 11/14/2009 04:30 12/15/2015 10:03  TSH Latest Ref Range: 0.50 - 4.30 mIU/L 2.350 2.60  T4,Free(Direct) Latest Ref Range: 0.8 - 1.4 ng/dL 9.07 1.2      Assessment & Plan:   1) Controlled type 1 diabetes mellitus   - Patient has currently uncontrolled symptomatic type 1 DM since  28 years of age.  He presents today with his CGM and Omnipod 5 showing fluctuating but overall stable glycemic profile overall.  His POCT A1c today is 6.4%, essentially unchanged from last visit.  He notes he has been eating much larger quantities recently, has been more stressed than usual.   Analysis of his CGM shows TIR 56%, TAR 41%, TBR 3%.  He does note that his Omnipod 5 and G7 sometimes loses connection.  - I reviewed his most recent labs with him.    He does not report any gross competitions of  diabetes, however, patient remains at a high risk for more acute and chronic complications of diabetes which include CAD, CVA, CKD, retinopathy, and neuropathy. These are all discussed in detail with the patient.  - Nutritional counseling repeated at each appointment due to patients tendency to fall back in to old habits.  - The patient admits there is a room for improvement in their diet and drink choices. -  Suggestion is made for the patient to avoid simple carbohydrates from their diet including Cakes, Sweet Desserts / Pastries, Ice Cream, Soda (diet and regular), Sweet Tea, Candies, Chips, Cookies, Sweet Pastries, Store Bought Juices, Alcohol in Excess of 1-2 drinks a day, Artificial Sweeteners, Coffee Creamer, and Sugar-free Products. This will help patient to have stable blood glucose profile and potentially avoid unintended weight gain.   - I encouraged the patient to switch to unprocessed or minimally processed complex starch and increased protein intake (animal or plant source), fruits, and vegetables.   - Patient is advised to stick to a routine mealtimes to eat 3 meals a day and avoid unnecessary snacks (to snack only to correct hypoglycemia).  - I have approached patient with the following individualized plan to manage diabetes and patient agrees:   -He is advised to continue using his Omnipod 5.  I did adjust his max basal rate and his max bolus rate today.  -He is advised to continue monitoring blood glucose at least 4 times per day (using his CGM), before meals and at bedtime and call the clinic if blood glucose is less than 70 or greater than 200 for 3 tests in a row.    - Insulin  is the exclusive choice of therapy for his type 1 diabetes.  - Patient specific target  A1c;  LDL, HDL, Triglycerides, and  Waist Circumference were discussed in detail.  2) BP/HTN:  His blood pressure is  controlled to target without the use of antihypertensive medications.  3) Lipids/HPL:  His  most recent lipid panel from 05/04/23 shows controlled LDL of 62.  He is not any cholesterol lowering agents at this time.     4)  Weight/Diet:  His Body mass index is 29.25 kg/m.  He is not a candidate for weight loss. CDE Consult is initiated, exercise, and detailed carbohydrates information provided.  5) Chronic Care/Health Maintenance: -Patient is encouraged to continue to follow up with Ophthalmology, Podiatrist at least yearly or according to recommendations, and advised to stay away from smoking. I have recommended yearly flu vaccine and pneumonia vaccination at least every 5 years; moderate intensity exercise for up to 150 minutes weekly; and  sleep for at least 7 hours a day.  6) Vitamin D  Deficiency His recent vitamin D  level on 05/04/23 was 26.3.  He is NOT currently taking OTC Vitamin D3 supplement.   I recommended he start OTC Vitamin D3 5000 units daily.     - I advised patient to maintain close follow up with Marvine Rush, MD for primary care needs.     I spent  46  minutes in the care of the patient today including review of labs from CMP, Lipids, Thyroid Function, Hematology (current and previous including abstractions from other facilities); face-to-face time discussing  his blood glucose readings/logs, discussing hypoglycemia and hyperglycemia episodes and symptoms, medications doses, his options of short and long term treatment based on the latest standards of care / guidelines;  discussion about incorporating lifestyle medicine;  and documenting the encounter. Risk reduction counseling performed per USPSTF guidelines to reduce obesity and cardiovascular risk factors.     Please refer to Patient Instructions for Blood Glucose Monitoring and Insulin /Medications Dosing Guide  in media tab for additional information. Please  also refer to  Patient Self Inventory in the Media  tab for reviewed elements of pertinent patient history.  Ray Espinoza participated in the  discussions, expressed understanding, and voiced agreement with the above plans.  All questions were answered to his satisfaction. he is encouraged to contact clinic should he have any questions or concerns prior to his return visit.   Follow up plan: - Return in about 4 months (around 01/14/2024) for Diabetes F/U with A1c in office, No previsit labs, Bring meter and logs.  Benton Rio, [facility] [address] [place], [place] [address] Phone: [phone] Fax: [phone]   09/14/2023, 7:24 AM

## 2023-11-24 ENCOUNTER — Other Ambulatory Visit: Payer: Self-pay | Admitting: Nurse Practitioner

## 2023-11-24 DIAGNOSIS — E109 Type 1 diabetes mellitus without complications: Secondary | ICD-10-CM

## 2024-01-17 ENCOUNTER — Ambulatory Visit: Admitting: Nurse Practitioner

## 2024-01-17 DIAGNOSIS — E109 Type 1 diabetes mellitus without complications: Secondary | ICD-10-CM

## 2024-01-17 DIAGNOSIS — E559 Vitamin D deficiency, unspecified: Secondary | ICD-10-CM

## 2024-01-17 DIAGNOSIS — Z794 Long term (current) use of insulin: Secondary | ICD-10-CM

## 2024-01-18 ENCOUNTER — Encounter: Payer: Self-pay | Admitting: Nurse Practitioner

## 2024-01-18 ENCOUNTER — Ambulatory Visit (INDEPENDENT_AMBULATORY_CARE_PROVIDER_SITE_OTHER): Admitting: Nurse Practitioner

## 2024-01-18 VITALS — BP 122/64 | HR 88 | Ht 66.0 in | Wt 189.6 lb

## 2024-01-18 DIAGNOSIS — E559 Vitamin D deficiency, unspecified: Secondary | ICD-10-CM

## 2024-01-18 DIAGNOSIS — E109 Type 1 diabetes mellitus without complications: Secondary | ICD-10-CM

## 2024-01-18 NOTE — Progress Notes (Signed)
 01/18/2024       Endocrinology follow-up note   Subjective:    Patient ID: Ray Espinoza, male    DOB: Dec 20, 1995.  He is here to follow-up for management of type 1 diabetes requested by  Marvine Rush, MD  Past Medical History:  Diagnosis Date   Diabetes mellitus without complication [facility])    History reviewed. No pertinent surgical history. Social History   Socioeconomic History   Marital status: Single    Spouse name: Not on file   Number of children: Not on file   Years of education: Not on file   Highest education level: Not on file  Occupational History   Not on file  Tobacco Use   Smoking status: Former    Types: Cigarettes   Smokeless tobacco: Never  Vaping Use   Vaping status: Never Used  Substance and Sexual Activity   Alcohol use: Not Currently    Comment: socially   Drug use: Not Currently    Types: Marijuana    Comment: stopped using 3 months ago   Sexual activity: Not on file  Other Topics Concern   Not on file  Social History Narrative   Not on file   Social Drivers of Health   Financial Resource Strain: Not on file  Food Insecurity: Not on file  Transportation Needs: Not on file  Physical Activity: Not on file  Stress: Not on file  Social Connections: Not on file   Outpatient Encounter Medications as of 01/18/2024  Medication Sig   Continuous Glucose Sensor (DEXCOM G7 SENSOR) MISC Inject 1 Application into the skin as directed. Change sensor every 10 days as directed.   famotidine  (PEPCID ) 20 MG tablet Take 1 tablet (20 mg total) by mouth 2 (two) times daily.   GLOBAL EASE INJECT PEN NEEDLES 31G X 5 MM MISC USE AS DIRECTED FOUR TIMES DAILY   Insulin  Disposable Pump (OMNIPOD 5 DEXG7G6 PODS GEN 5) MISC CHANGE POD EVERY 48 TO 72 HOURS   Insulin  Disposable Pump (OMNIPOD 5 G6 INTRO, GEN 5,) KIT CHANGE pod every 48-72 hours   insulin  lispro (HUMALOG ) 100 UNIT/ML injection USE WITH insulin  pump FOR total daily DOSE around 110 UNITS PER DAY    olmesartan (BENICAR) 20 MG tablet Take 20 mg by mouth daily.   No facility-administered encounter medications on file as of 01/18/2024.   ALLERGIES: Allergies  Allergen Reactions   Codeine Hives and Itching   Sulfamethoxazole     Other reaction(s): GI Upset (intolerance)   VACCINATION STATUS:  There is no immunization history on file for this patient.  Diabetes He presents for his follow-up diabetic visit. He has type 1 diabetes mellitus. Onset time: He was diagnosed at approximate age of 14 years. His disease course has been stable. There are no hypoglycemic associated symptoms. Pertinent negatives for hypoglycemia include no confusion, headaches, pallor or seizures. Associated symptoms include fatigue. Pertinent negatives for diabetes include no chest pain, no polydipsia, no polyphagia, no polyuria and no weakness. There are no hypoglycemic complications. Symptoms are improving. There are no diabetic complications. Risk factors for coronary artery disease include diabetes mellitus and male sex. Current diabetic treatment includes insulin  pump. He is compliant with treatment most of the time. His weight is fluctuating minimally. He is following a generally unhealthy diet. When asked about meal planning, he reported none. He has not had a previous visit with a dietitian. He participates in exercise intermittently. His home blood glucose trend is fluctuating minimally. His overall blood  glucose range is 140-180 mg/dl. (He presents today with his CGM and Omnipod 5 showing stable glycemic profile overall.  His  A1c was unable to be run today due to equipment malfunction.  Analysis of his CGM shows TIR 59%, TAR 39%, TBR 2%.  He has been better with bolusing premeals.  He just welcomed his second child last week!) An ACE inhibitor/angiotensin II receptor blocker is contraindicated. He does not see a podiatrist.Eye exam is not current.    Review of systems  Constitutional: + Minimally fluctuating body  weight,  current Body mass index is 30.6 kg/m. , no fatigue, no subjective hyperthermia, no subjective hypothermia Eyes: no blurry vision, no xerophthalmia ENT: no sore throat, no nodules palpated in throat, no dysphagia/odynophagia, no hoarseness Cardiovascular: no chest pain, no shortness of breath, no palpitations, no leg swelling Respiratory: no cough, no shortness of breath Gastrointestinal: no nausea/vomiting/diarrhea Musculoskeletal: no muscle/joint aches Skin: no rashes, no hyperemia Neurological: no tremors, no numbness, no tingling, no dizziness Psychiatric: no depression, no anxiety   Objective:    BP 122/64 (BP Location: Left Arm, Patient Position: Sitting, Cuff Size: Large)   Pulse 88   Ht 5' 6 (1.676 m)   Wt 189 lb 9.6 oz (86 kg)   BMI 30.60 kg/m   Wt Readings from Last 3 Encounters:  01/18/24 189 lb 9.6 oz (86 kg)  09/13/23 181 lb 3.2 oz (82.2 kg)  05/25/23 160 lb (72.6 kg)    BP Readings from Last 3 Encounters:  01/18/24 122/64  09/13/23 120/82  05/25/23 (!) 146/75     Physical Exam- Limited  Constitutional:  Body mass index is 30.6 kg/m. , not in acute distress, normal state of mind Eyes:  EOMI, no exophthalmos Musculoskeletal: no gross deformities, strength intact in all four extremities, no gross restriction of joint movements Skin:  no rashes, no hyperemia Neurological: no tremor with outstretched hands    Diabetic Foot Exam - Simple   No data filed      Diabetic Labs (most recent): Lab Results  Component Value Date   HGBA1C 6.4 (A) 09/13/2023   HGBA1C 7.1 (A) 01/11/2023   HGBA1C 6.7 (A) 07/08/2022   MICROALBUR 65.9 08/10/2019   MICROALBUR 35.4 09/15/2018   No results found for this or any previous visit (from the past 2160 hours).         Latest Ref Rng & Units 05/04/2023    9:02 AM 03/18/2023   12:36 AM 06/30/2022    8:27 AM  CMP  Glucose 70 - 99 mg/dL 884  856  821   BUN 6 - 20 mg/dL 14  16  14    Creatinine 0.76 - 1.27 mg/dL  9.17  9.27  9.24   Sodium 134 - 144 mmol/L 140  138  138   Potassium 3.5 - 5.2 mmol/L 4.4  3.9  4.5   Chloride 96 - 106 mmol/L 101  103  102   CO2 20 - 29 mmol/L 23  25  22    Calcium 8.7 - 10.2 mg/dL 9.7  9.8  9.6   Total Protein 6.0 - 8.5 g/dL 7.0  7.3  6.9   Total Bilirubin 0.0 - 1.2 mg/dL 0.8  0.8  0.8   Alkaline Phos 44 - 121 IU/L 88  54  74   AST 0 - 40 IU/L 33  21  31   ALT 0 - 44 IU/L 73  23  42      Results for Adams, Saabir L (MRN  [id number]) as of 04/20/2019 10:10  Ref. Range 11/14/2009 04:30 12/15/2015 10:03  TSH Latest Ref Range: 0.50 - 4.30 mIU/L 2.350 2.60  T4,Free(Direct) Latest Ref Range: 0.8 - 1.4 ng/dL 9.07 1.2      Assessment & Plan:   1) Controlled type 1 diabetes mellitus   - Patient has currently uncontrolled symptomatic type 1 DM since  28 years of age.  He presents today with his CGM and Omnipod 5 showing stable glycemic profile overall.  His  A1c was unable to be run today due to equipment malfunction.  Analysis of his CGM shows TIR 59%, TAR 39%, TBR 2%.  He has been better with bolusing premeals.  He just welcomed his second child last week!  - I reviewed his most recent labs with him.    He does not report any gross competitions of diabetes, however, patient remains at a high risk for more acute and chronic complications of diabetes which include CAD, CVA, CKD, retinopathy, and neuropathy. These are all discussed in detail with the patient.  - Nutritional counseling repeated/built upon at each appointment.  - The patient admits there is a room for improvement in their diet and drink choices. -  Suggestion is made for the patient to avoid simple carbohydrates from their diet including Cakes, Sweet Desserts / Pastries, Ice Cream, Soda (diet and regular), Sweet Tea, Candies, Chips, Cookies, Sweet Pastries, Store Bought Juices, Alcohol in Excess of 1-2 drinks a day, Artificial Sweeteners, Coffee Creamer, and Sugar-free Products. This will help patient  to have stable blood glucose profile and potentially avoid unintended weight gain.   - I encouraged the patient to switch to unprocessed or minimally processed complex starch and increased protein intake (animal or plant source), fruits, and vegetables.   - Patient is advised to stick to a routine mealtimes to eat 3 meals a day and avoid unnecessary snacks (to snack only to correct hypoglycemia).  - I have approached patient with the following individualized plan to manage diabetes and patient agrees:   -He is advised to continue using his Omnipod 5.  II did not make any setting adjustments today given at goal glycemic profile.  -He is advised to continue monitoring blood glucose at least 4 times per day (using his CGM), before meals and at bedtime and call the clinic if blood glucose is less than 70 or greater than 200 for 3 tests in a row.    - Insulin  is the exclusive choice of therapy for his type 1 diabetes.  - Patient specific target  A1c;  LDL, HDL, Triglycerides, and  Waist Circumference were discussed in detail.  2) BP/HTN:  His blood pressure is controlled to target without the use of antihypertensive medications.  3) Lipids/HPL:  His most recent lipid panel from 05/04/23 shows controlled LDL of 62.  He is not any cholesterol lowering agents at this time.   Will recheck lipid panel prior to next visit.  4)  Weight/Diet:  His Body mass index is 30.6 kg/m.  He is not a candidate for weight loss. CDE Consult is initiated, exercise, and detailed carbohydrates information provided.  5) Chronic Care/Health Maintenance: -Patient is encouraged to continue to follow up with Ophthalmology, Podiatrist at least yearly or according to recommendations, and advised to stay away from smoking. I have recommended yearly flu vaccine and pneumonia vaccination at least every 5 years; moderate intensity exercise for up to 150 minutes weekly; and  sleep for at least 7 hours a day.  6)  Vitamin D   Deficiency His recent vitamin D  level on 05/04/23 was 26.3.  He is NOT currently taking OTC Vitamin D3 supplement.   I recommended he start OTC Vitamin D3 5000 units daily.  Will recheck Vitamin D  prior to next visit.    - I advised patient to maintain close follow up with Marvine Rush, MD for primary care needs.     I spent  48  minutes in the care of the patient today including review of labs from CMP, Lipids, Thyroid Function, Hematology (current and previous including abstractions from other facilities); face-to-face time discussing  his blood glucose readings/logs, discussing hypoglycemia and hyperglycemia episodes and symptoms, medications doses, his options of short and long term treatment based on the latest standards of care / guidelines;  discussion about incorporating lifestyle medicine;  and documenting the encounter. Risk reduction counseling performed per USPSTF guidelines to reduce obesity and cardiovascular risk factors.     Please refer to Patient Instructions for Blood Glucose Monitoring and Insulin /Medications Dosing Guide  in media tab for additional information. Please  also refer to  Patient Self Inventory in the Media  tab for reviewed elements of pertinent patient history.  Massie CROME Mehl participated in the discussions, expressed understanding, and voiced agreement with the above plans.  All questions were answered to his satisfaction. he is encouraged to contact clinic should he have any questions or concerns prior to his return visit.   Follow up plan: - Return in about 6 months (around 07/17/2024) for Diabetes F/U with A1c in office, Previsit labs, Bring meter and logs.  Benton Rio, [facility] [address] [place], [place] [address] Phone: [phone] Fax: [phone]   01/18/2024, 4:26 PM

## 2024-07-18 ENCOUNTER — Ambulatory Visit: Admitting: Nurse Practitioner
